# Patient Record
Sex: Female | Born: 1985 | Race: White | Hispanic: No | Marital: Married | Smoking: Never smoker
Health system: Southern US, Community
[De-identification: ages and names within clinical notes are randomized; demographics above are authoritative.]

## PROBLEM LIST (undated history)

## (undated) DIAGNOSIS — E079 Disorder of thyroid, unspecified: Secondary | ICD-10-CM

## (undated) DIAGNOSIS — Z8619 Personal history of other infectious and parasitic diseases: Secondary | ICD-10-CM

## (undated) DIAGNOSIS — G5603 Carpal tunnel syndrome, bilateral upper limbs: Secondary | ICD-10-CM

## (undated) HISTORY — PX: IUD REMOVAL: SHX5392

## (undated) HISTORY — DX: Personal history of other infectious and parasitic diseases: Z86.19

## (undated) HISTORY — DX: Carpal tunnel syndrome, bilateral upper limbs: G56.03

## (undated) HISTORY — DX: Disorder of thyroid, unspecified: E07.9

---

## 2009-10-24 ENCOUNTER — Inpatient Hospital Stay: Payer: Self-pay

## 2010-01-18 ENCOUNTER — Ambulatory Visit: Payer: Self-pay | Admitting: Obstetrics & Gynecology

## 2010-01-21 ENCOUNTER — Ambulatory Visit: Payer: Self-pay | Admitting: Obstetrics & Gynecology

## 2014-03-03 NOTE — L&D Delivery Note (Signed)
Delivery Note At 9:48 PM a viable and healthy female was delivered via Vaginal, Spontaneous Delivery (Presentation: Left Occiput Anterior).  APGAR: 9, 9; weight pending .   Placenta status: Intact, Spontaneous.  Cord: 3 vessels with the following complications: None.  Cord pH: na  Anesthesia:  none Episiotomy:  none Lacerations:  none Suture Repair: na Est. Blood Loss (mL):  50  Mom to postpartum.  Baby to Couplet care / Skin to Skin.  Keyira Mondesir J 12/29/2014, 10:01 PM

## 2014-06-02 LAB — OB RESULTS CONSOLE RUBELLA ANTIBODY, IGM: RUBELLA: IMMUNE

## 2014-06-02 LAB — OB RESULTS CONSOLE ABO/RH: RH TYPE: POSITIVE

## 2014-06-02 LAB — OB RESULTS CONSOLE GC/CHLAMYDIA
Chlamydia: NEGATIVE
GC PROBE AMP, GENITAL: NEGATIVE

## 2014-06-02 LAB — OB RESULTS CONSOLE RPR: RPR: NONREACTIVE

## 2014-06-02 LAB — OB RESULTS CONSOLE HEPATITIS B SURFACE ANTIGEN: Hepatitis B Surface Ag: NEGATIVE

## 2014-06-02 LAB — OB RESULTS CONSOLE ANTIBODY SCREEN: ANTIBODY SCREEN: NEGATIVE

## 2014-06-02 LAB — OB RESULTS CONSOLE HIV ANTIBODY (ROUTINE TESTING): HIV: NONREACTIVE

## 2014-11-29 LAB — OB RESULTS CONSOLE GBS: STREP GROUP B AG: POSITIVE

## 2014-12-27 ENCOUNTER — Other Ambulatory Visit: Payer: Self-pay | Admitting: Obstetrics and Gynecology

## 2014-12-27 ENCOUNTER — Telehealth (HOSPITAL_COMMUNITY): Payer: Self-pay | Admitting: *Deleted

## 2014-12-27 ENCOUNTER — Encounter (HOSPITAL_COMMUNITY): Payer: Self-pay | Admitting: *Deleted

## 2014-12-27 NOTE — Telephone Encounter (Signed)
Preadmission screen  

## 2014-12-29 ENCOUNTER — Inpatient Hospital Stay (HOSPITAL_COMMUNITY)
Admission: AD | Admit: 2014-12-29 | Discharge: 2014-12-30 | DRG: 775 | Disposition: A | Discharge status: Home / Self Care | Payer: No Typology Code available for payment source | Source: Ambulatory Visit | Attending: Obstetrics and Gynecology | Admitting: Obstetrics and Gynecology

## 2014-12-29 ENCOUNTER — Encounter (HOSPITAL_COMMUNITY): Payer: Self-pay | Admitting: Student

## 2014-12-29 DIAGNOSIS — Z3A40 40 weeks gestation of pregnancy: Secondary | ICD-10-CM | POA: Diagnosis not present

## 2014-12-29 DIAGNOSIS — Z841 Family history of disorders of kidney and ureter: Secondary | ICD-10-CM | POA: Diagnosis not present

## 2014-12-29 DIAGNOSIS — O480 Post-term pregnancy: Principal | ICD-10-CM | POA: Diagnosis not present

## 2014-12-29 DIAGNOSIS — Z809 Family history of malignant neoplasm, unspecified: Secondary | ICD-10-CM

## 2014-12-29 DIAGNOSIS — Z825 Family history of asthma and other chronic lower respiratory diseases: Secondary | ICD-10-CM

## 2014-12-29 DIAGNOSIS — Z3490 Encounter for supervision of normal pregnancy, unspecified, unspecified trimester: Secondary | ICD-10-CM

## 2014-12-29 LAB — CBC
HEMATOCRIT: 38 % (ref 36.0–46.0)
Hemoglobin: 13.3 g/dL (ref 12.0–15.0)
MCH: 30.4 pg (ref 26.0–34.0)
MCHC: 35 g/dL (ref 30.0–36.0)
MCV: 86.8 fL (ref 78.0–100.0)
Platelets: 230 10*3/uL (ref 150–400)
RBC: 4.38 MIL/uL (ref 3.87–5.11)
RDW: 13.8 % (ref 11.5–15.5)
WBC: 23.2 10*3/uL — ABNORMAL HIGH (ref 4.0–10.5)

## 2014-12-29 LAB — TYPE AND SCREEN
ABO/RH(D): O POS
Antibody Screen: NEGATIVE

## 2014-12-29 MED ORDER — FLEET ENEMA 7-19 GM/118ML RE ENEM: 1.0000 | ENEMA | RECTAL | Status: DC | PRN

## 2014-12-29 MED ORDER — WITCH HAZEL-GLYCERIN EX PADS
1.0000 "application " | MEDICATED_PAD | CUTANEOUS | Status: DC | PRN
  Administered 2014-12-30: 1 via TOPICAL

## 2014-12-29 MED ORDER — OXYTOCIN 40 UNITS IN LACTATED RINGERS INFUSION - SIMPLE MED
62.5000 mL/h | INTRAVENOUS | Status: DC
  Administered 2014-12-29: 40 [IU] via INTRAVENOUS
  Administered 2014-12-29: 999 mL/h via INTRAVENOUS

## 2014-12-29 MED ORDER — CITRIC ACID-SODIUM CITRATE 334-500 MG/5ML PO SOLN: 30.0000 mL | ORAL | Status: DC | PRN

## 2014-12-29 MED ORDER — OXYCODONE-ACETAMINOPHEN 5-325 MG PO TABS: 2.0000 | ORAL_TABLET | ORAL | Status: DC | PRN

## 2014-12-29 MED ORDER — OXYTOCIN 40 UNITS IN LACTATED RINGERS INFUSION - SIMPLE MED
INTRAVENOUS | Status: AC
  Administered 2014-12-29: 40 [IU] via INTRAVENOUS
  Filled 2014-12-29: qty 1000

## 2014-12-29 MED ORDER — ACETAMINOPHEN 325 MG PO TABS: 650.0000 mg | ORAL_TABLET | ORAL | Status: DC | PRN

## 2014-12-29 MED ORDER — OXYTOCIN 40 UNITS IN LACTATED RINGERS INFUSION - SIMPLE MED: 1.0000 m[IU]/min | INTRAVENOUS | Status: DC

## 2014-12-29 MED ORDER — SODIUM CHLORIDE 0.9 % IV SOLN
2.0000 g | Freq: Once | INTRAVENOUS | Status: AC
  Administered 2014-12-29: 2 g via INTRAVENOUS
  Filled 2014-12-29: qty 2000

## 2014-12-29 MED ORDER — OXYCODONE-ACETAMINOPHEN 5-325 MG PO TABS: 1.0000 | ORAL_TABLET | ORAL | Status: DC | PRN

## 2014-12-29 MED ORDER — SODIUM CHLORIDE 0.9 % IV SOLN
1.0000 g | INTRAVENOUS | Status: DC
  Filled 2014-12-29 (×5): qty 1000

## 2014-12-29 MED ORDER — LACTATED RINGERS IV SOLN: 500.0000 mL | INTRAVENOUS | Status: DC | PRN

## 2014-12-29 MED ORDER — BUTORPHANOL TARTRATE 1 MG/ML IJ SOLN
1.0000 mg | Freq: Once | INTRAMUSCULAR | Status: AC
  Administered 2014-12-29: 1 mg via INTRAVENOUS

## 2014-12-29 MED ORDER — LIDOCAINE HCL (PF) 1 % IJ SOLN
INTRAMUSCULAR | Status: AC
  Administered 2014-12-29: 30 mL via SUBCUTANEOUS
  Filled 2014-12-29: qty 30

## 2014-12-29 MED ORDER — TERBUTALINE SULFATE 1 MG/ML IJ SOLN
0.2500 mg | Freq: Once | INTRAMUSCULAR | Status: DC | PRN
  Filled 2014-12-29: qty 1

## 2014-12-29 MED ORDER — PENICILLIN G POTASSIUM 5000000 UNITS IJ SOLR
5.0000 10*6.[IU] | Freq: Once | INTRAVENOUS | Status: DC
  Filled 2014-12-29: qty 5

## 2014-12-29 MED ORDER — LIDOCAINE HCL (PF) 1 % IJ SOLN
30.0000 mL | INTRAMUSCULAR | Status: DC | PRN
Start: 2014-12-29 — End: 2014-12-30
  Administered 2014-12-29: 30 mL via SUBCUTANEOUS
  Filled 2014-12-29: qty 30

## 2014-12-29 MED ORDER — BUTORPHANOL TARTRATE 1 MG/ML IJ SOLN
INTRAMUSCULAR | Status: AC
  Administered 2014-12-29: 1 mg via INTRAVENOUS
  Filled 2014-12-29: qty 1

## 2014-12-29 MED ORDER — PENICILLIN G POTASSIUM 5000000 UNITS IJ SOLR
2.5000 10*6.[IU] | INTRAVENOUS | Status: DC
  Filled 2014-12-29 (×2): qty 2.5

## 2014-12-29 MED ORDER — OXYTOCIN BOLUS FROM INFUSION: 500.0000 mL | INTRAVENOUS | Status: DC

## 2014-12-29 MED ORDER — DIBUCAINE 1 % RE OINT
1.0000 "application " | TOPICAL_OINTMENT | RECTAL | Status: DC | PRN
  Administered 2014-12-30: 1 via RECTAL
  Filled 2014-12-29: qty 28

## 2014-12-29 MED ORDER — ONDANSETRON HCL 4 MG/2ML IJ SOLN
4.0000 mg | Freq: Four times a day (QID) | INTRAMUSCULAR | Status: DC | PRN
Start: 2014-12-29 — End: 2014-12-30

## 2014-12-29 MED ORDER — IBUPROFEN 600 MG PO TABS
600.0000 mg | ORAL_TABLET | Freq: Four times a day (QID) | ORAL | Status: DC
  Administered 2014-12-29 – 2014-12-30 (×4): 600 mg via ORAL
  Filled 2014-12-29 (×4): qty 1

## 2014-12-29 MED ORDER — BENZOCAINE-MENTHOL 20-0.5 % EX AERO
1.0000 "application " | INHALATION_SPRAY | CUTANEOUS | Status: DC | PRN
  Administered 2014-12-29: 1 via TOPICAL
  Filled 2014-12-29: qty 56

## 2014-12-29 MED ORDER — LACTATED RINGERS IV SOLN
INTRAVENOUS | Status: DC
  Administered 2014-12-29: 20:00:00 via INTRAVENOUS

## 2014-12-29 NOTE — Plan of Care (Signed)
Problem: Consults Goal: [facility] Patient Education (See Patient Education module for education specifics.) Outcome: Not Progressing Pt oriented to room, plan of care discussed, and questions answered.

## 2014-12-29 NOTE — Progress Notes (Signed)
Pt assisted to BR for initial PP void. Pt ambulated safely, void of urine noted, pt educated on perineal hygiene and can return correct demo. Pt asisted to w/c safely and prepped for routine transfer to M/B.  

## 2014-12-29 NOTE — H&P (Signed)
Shelby Turner is a 29 y.o. female presenting for labor.  Maternal Medical History:  Reason for admission: Contractions.   Contractions: Onset was 1-2 hours ago.   Frequency: regular.   Perceived severity is moderate.    Fetal activity: Perceived fetal activity is normal.   Last perceived fetal movement was within the past hour.    Prenatal complications: no prenatal complications Prenatal Complications - Diabetes: none.    OB History    Gravida Para Term Preterm AB TAB SAB Ectopic Multiple Living   3 1 1  1  1   1      Past Medical History  Diagnosis Date  . Hx of varicella    Past Surgical History  Procedure Laterality Date  . Iud removal      implanted in uterus   Family History: family history includes COPD in her paternal grandfather; Cancer in her maternal grandfather; Kidney Stones in her maternal aunt and maternal grandfather. Social History:  reports that she has never smoked. She has never used smokeless tobacco. She reports that she does not drink alcohol or use illicit drugs.   Prenatal Transfer Tool  Maternal Diabetes: No Genetic Screening: Normal Maternal Ultrasounds/Referrals: Normal Fetal Ultrasounds or other Referrals:  None Maternal Substance Abuse:  No Significant Maternal Medications:  None Significant Maternal Lab Results:  None Other Comments:  gbs positive  Review of Systems  Constitutional: Negative.   All other systems reviewed and are negative.   Dilation: Lip/rim Effacement (%): 100 Station: 0 Exam by:: Dr. Billy Coastaavon Blood pressure 135/83, pulse 88, temperature 98 F (36.7 C), temperature source Oral, resp. rate 22, height 5\' 8"  (1.727 m), weight 166 lb (75.297 kg), last menstrual period 03/21/2014. Maternal Exam:  Uterine Assessment: Contraction strength is moderate.  Contraction frequency is regular.   Abdomen: Patient reports no abdominal tenderness. Fetal presentation: vertex  Introitus: Normal vulva. Normal vagina.  Ferning  test: negative.  Nitrazine test: negative. Amniotic fluid character: not assessed.  Pelvis: adequate for delivery.   Cervix: Cervix evaluated by digital exam.     Physical Exam  Vitals reviewed. Constitutional: She is oriented to person, place, and time. She appears well-developed and well-nourished.  Neck: Normal range of motion. Neck supple.  Cardiovascular: Normal rate and regular rhythm.   Respiratory: Effort normal and breath sounds normal.  GI: Soft. Bowel sounds are normal.  Genitourinary: Vagina normal. Guaiac positive stool.  Musculoskeletal: Normal range of motion.  Neurological: She is alert and oriented to person, place, and time. She has normal reflexes.  Skin: Skin is warm and dry.  Psychiatric: She has a normal mood and affect.    Prenatal labs: ABO, Rh: --/--/O POS (10/28 1945) Antibody: NEG (10/28 1945) Rubella: Immune (04/01 0000) RPR: Nonreactive (04/01 0000)  HBsAg: Negative (04/01 0000)  HIV: Non-reactive (04/01 0000)  GBS: Positive (09/28 0000)   Assessment/Plan: Active labor gBS positive admit   Shelby Turner J 12/29/2014, 9:59 PM

## 2014-12-29 NOTE — MAU Note (Signed)
Pt to be triaged in 166 per Cleone SlimH. Mitchell RN

## 2014-12-29 NOTE — Progress Notes (Signed)
Bedside report given and care relinquished to DushoreSue, [place].

## 2014-12-30 ENCOUNTER — Encounter (HOSPITAL_COMMUNITY): Payer: Self-pay | Admitting: Obstetrics and Gynecology

## 2014-12-30 LAB — CBC
HEMATOCRIT: 32.8 % — AB (ref 36.0–46.0)
HEMOGLOBIN: 11.1 g/dL — AB (ref 12.0–15.0)
MCH: 29.5 pg (ref 26.0–34.0)
MCHC: 33.8 g/dL (ref 30.0–36.0)
MCV: 87.2 fL (ref 78.0–100.0)
Platelets: 193 10*3/uL (ref 150–400)
RBC: 3.76 MIL/uL — AB (ref 3.87–5.11)
RDW: 13.8 % (ref 11.5–15.5)
WBC: 21.2 10*3/uL — ABNORMAL HIGH (ref 4.0–10.5)

## 2014-12-30 LAB — RPR: RPR: NONREACTIVE

## 2014-12-30 LAB — ABO/RH: ABO/RH(D): O POS

## 2014-12-30 MED ORDER — ONDANSETRON HCL 4 MG PO TABS: 4.0000 mg | ORAL_TABLET | ORAL | Status: DC | PRN

## 2014-12-30 MED ORDER — PRENATAL MULTIVITAMIN CH
1.0000 | ORAL_TABLET | Freq: Every day | ORAL | Status: DC
  Administered 2014-12-30: 1 via ORAL
  Filled 2014-12-30: qty 1

## 2014-12-30 MED ORDER — DIPHENHYDRAMINE HCL 25 MG PO CAPS: 25.0000 mg | ORAL_CAPSULE | Freq: Four times a day (QID) | ORAL | Status: DC | PRN

## 2014-12-30 MED ORDER — IBUPROFEN 600 MG PO TABS: 600.0000 mg | ORAL_TABLET | Freq: Four times a day (QID) | ORAL | Status: DC

## 2014-12-30 MED ORDER — OXYCODONE-ACETAMINOPHEN 5-325 MG PO TABS: 1.0000 | ORAL_TABLET | ORAL | Status: DC | PRN

## 2014-12-30 MED ORDER — METHYLERGONOVINE MALEATE 0.2 MG PO TABS: 0.2000 mg | ORAL_TABLET | ORAL | Status: DC | PRN

## 2014-12-30 MED ORDER — ZOLPIDEM TARTRATE 5 MG PO TABS: 5.0000 mg | ORAL_TABLET | Freq: Every evening | ORAL | Status: DC | PRN

## 2014-12-30 MED ORDER — OXYCODONE-ACETAMINOPHEN 5-325 MG PO TABS: 2.0000 | ORAL_TABLET | ORAL | Status: DC | PRN

## 2014-12-30 MED ORDER — SIMETHICONE 80 MG PO CHEW: 80.0000 mg | CHEWABLE_TABLET | ORAL | Status: DC | PRN

## 2014-12-30 MED ORDER — LANOLIN HYDROUS EX OINT: TOPICAL_OINTMENT | CUTANEOUS | Status: DC | PRN

## 2014-12-30 MED ORDER — METHYLERGONOVINE MALEATE 0.2 MG/ML IJ SOLN: 0.2000 mg | INTRAMUSCULAR | Status: DC | PRN

## 2014-12-30 MED ORDER — TETANUS-DIPHTH-ACELL PERTUSSIS 5-2.5-18.5 LF-MCG/0.5 IM SUSP: 0.5000 mL | Freq: Once | INTRAMUSCULAR | Status: DC

## 2014-12-30 MED ORDER — SENNOSIDES-DOCUSATE SODIUM 8.6-50 MG PO TABS: 2.0000 | ORAL_TABLET | ORAL | Status: DC

## 2014-12-30 MED ORDER — ONDANSETRON HCL 4 MG/2ML IJ SOLN: 4.0000 mg | INTRAMUSCULAR | Status: DC | PRN

## 2014-12-30 MED ORDER — ACETAMINOPHEN 325 MG PO TABS: 650.0000 mg | ORAL_TABLET | ORAL | Status: DC | PRN

## 2014-12-30 NOTE — Discharge Summary (Signed)
OB Discharge Summary     Patient Name: Shelby Turner DOB: 1985-11-15 MRN: [id number]  Date of admission: 12/29/2014 Delivering MD: Olivia MackieAAVON, RICHARD   Date of discharge: 12/30/2014  Admitting diagnosis: 40 WKS ACTIVE LABOR Intrauterine pregnancy: 5610w3d     Secondary diagnosis:  Principal Problem:   Postpartum care following vaginal delivery (10/28) Active Problems:   Pregnancy  Additional problems: none     Discharge diagnosis: Postterm Pregnancy - delivered                                                                                                Post partum procedures:none  Augmentation: none  Complications: None  Hospital course:  Onset of Labor With Vaginal Delivery     29 y.o. yo [id number] at 410w3d was admitted in Active Laboron 12/29/2014. Patient had an uncomplicated labor course as follows:  Membrane Rupture Time/Date: 7:35 PM ,12/29/2014   Intrapartum Procedures: Episiotomy: None [1]                                         Lacerations:  None [1]  Patient had a delivery of a Viable infant. 12/29/2014  Information for the patient's newborn:  Kirke ShaggyVeiga, Girl Deshonda [[id number]][[id number]]  Delivery Method: Vaginal, Spontaneous Delivery (Filed from Delivery Summary)    Pateint had an uncomplicated postpartum course.  She is ambulating, tolerating a regular diet, passing flatus, and urinating well. Patient is discharged home in stable condition on No discharge date for patient encounter.Marland Kitchen.    Physical exam  Filed Vitals:   12/29/14 2315 12/29/14 2350 12/30/14 0040 12/30/14 0424  BP: 119/56 117/57 120/54 112/53  Pulse: 100 99 92 68  Temp:  98.8 F (37.1 C) 99 F (37.2 C) 99 F (37.2 C)  TempSrc:  Oral Oral Oral  Resp:  18 18 18   Height:      Weight:       General: alert, cooperative and no distress Lochia: appropriate Uterine Fundus: firm DVT Evaluation: No evidence of DVT seen on physical exam. Negative Homan's sign. No cords or calf tenderness. No significant  calf/ankle edema. Labs: Lab Results  Component Value Date   WBC 21.2* 12/30/2014   HGB 11.1* 12/30/2014   HCT 32.8* 12/30/2014   MCV 87.2 12/30/2014   PLT 193 12/30/2014   No flowsheet data found.  Discharge instruction: per After Visit Summary and "Baby and Me Booklet".  Medications:  Current facility-administered medications:  .  acetaminophen (TYLENOL) tablet 650 mg, 650 mg, Oral, Q4H PRN, Olivia Mackieichard Taavon, MD .  benzocaine-Menthol (DERMOPLAST) 20-0.5 % topical spray 1 application, 1 application, Topical, PRN, Olivia Mackieichard Taavon, MD, 1 application at 12/29/14 2302 .  witch hazel-glycerin (TUCKS) pad 1 application, 1 application, Topical, PRN, 1 application at 12/30/14 0529 **AND** dibucaine (NUPERCAINAL) 1 % rectal ointment 1 application, 1 application, Rectal, PRN, Olivia Mackieichard Taavon, MD, 1 application at 12/30/14 0529 .  diphenhydrAMINE (BENADRYL) capsule 25 mg, 25 mg, Oral, Q6H PRN, Olivia Mackieichard Taavon, MD .  ibuprofen (ADVIL,MOTRIN) tablet 600  mg, 600 mg, Oral, 4 times per day, Olivia Mackie, MD, 600 mg at 12/30/14 0529 .  lanolin ointment, , Topical, PRN, Olivia Mackie, MD .  methylergonovine (METHERGINE) tablet 0.2 mg, 0.2 mg, Oral, Q4H PRN **OR** methylergonovine (METHERGINE) injection 0.2 mg, 0.2 mg, Intramuscular, Q4H PRN, Olivia Mackie, MD .  ondansetron (ZOFRAN) tablet 4 mg, 4 mg, Oral, Q4H PRN **OR** ondansetron (ZOFRAN) injection 4 mg, 4 mg, Intravenous, Q4H PRN, Olivia Mackie, MD .  oxyCODONE-acetaminophen (PERCOCET/ROXICET) 5-325 MG per tablet 1 tablet, 1 tablet, Oral, Q4H PRN, Olivia Mackie, MD .  oxyCODONE-acetaminophen (PERCOCET/ROXICET) 5-325 MG per tablet 2 tablet, 2 tablet, Oral, Q4H PRN, Olivia Mackie, MD .  prenatal multivitamin tablet 1 tablet, 1 tablet, Oral, Q1200, Olivia Mackie, MD .  senna-docusate (Senokot-S) tablet 2 tablet, 2 tablet, Oral, Q24H, Olivia Mackie, MD .  simethicone (MYLICON) chewable tablet 80 mg, 80 mg, Oral, PRN, Olivia Mackie, MD .  Tdap  (BOOSTRIX) injection 0.5 mL, 0.5 mL, Intramuscular, Once, Olivia Mackie, MD, 0.5 mL at 12/30/14 0115 .  zolpidem (AMBIEN) tablet 5 mg, 5 mg, Oral, QHS PRN, Olivia Mackie, MD After visit meds:    Medication List    TAKE these medications        ibuprofen 600 MG tablet  Commonly known as:  ADVIL,MOTRIN  Take 1 tablet (600 mg total) by mouth every 6 (six) hours.     Lysine 500 MG Caps  Take 1 capsule by mouth daily.     prenatal multivitamin Tabs tablet  Take 1 tablet by mouth daily at 12 noon.        Diet: routine diet  Activity: Advance as tolerated. Pelvic rest for 6 weeks.   Outpatient follow up:6 weeks Follow up Appt:Future Appointments Date Time Provider Department Center  01/01/2015 7:00 AM [facility] None   Follow up Visit:No Follow-up on file.  Postpartum contraception: Undecided  Newborn Data: Live born female on 12/29/2014 Birth Weight: 6 lb 2.8 oz (2800 g) APGAR: 9, 9  Baby Feeding: Breast Disposition:home with mother   12/30/2014 7:33 AM Jovante Hammitt, Terence Lux, MSN, CNM

## 2014-12-30 NOTE — Lactation Note (Signed)
This note was copied from the chart of Shelby Richardson Doppshley Mcswain. Lactation Consultation Note Experienced BF mom BF her 1st child 8 months. Denies difficulty. States this baby is BF well w/no problems. Currently BF in cradle position. Denies painful latches.  Mom encouraged to feed baby w/feeding cuesMom encouraged to do skin-to-skin.Reviewed Baby & Me book's Breastfeeding Basics. WH/LC brochure given w/resources, support groups and LC services. Patient Name: Shelby Turner ZOXWR'UToday's Date: 12/30/2014 Reason for consult: Initial assessment   Maternal Data Has patient been taught Hand Expression?: Yes Does the patient have breastfeeding experience prior to this delivery?: Yes  Feeding Feeding Type: Breast Fed Length of feed: 15 min  LATCH Score/Interventions Latch: Grasps breast easily, tongue down, lips flanged, rhythmical sucking.  Audible Swallowing: A few with stimulation Intervention(s): Hand expression  Type of Nipple: Everted at rest and after stimulation  Comfort (Breast/Nipple): Soft / non-tender     Hold (Positioning): No assistance needed to correctly position infant at breast. Intervention(s): Skin to skin;Position options;Breastfeeding basics reviewed;Support Pillows  LATCH Score: 9  Lactation Tools Discussed/Used     Consult Status Consult Status: PRN Date: 12/31/14 Follow-up type: In-patient    Avannah Decker, Diamond NickelLAURA G 12/30/2014, 1:20 PM

## 2014-12-30 NOTE — Discharge Instructions (Signed)
Breast Pumping Tips °If you are breastfeeding, there may be times when you cannot feed your baby directly. Returning to work or going on a trip are common examples. Pumping allows you to store breast milk and feed it to your baby later.  °You may not get much milk when you first start to pump. Your breasts should start to make more after a few days. If you pump at the times you usually feed your baby, you may be able to keep making enough milk to feed your baby without also using formula. The more often you pump, the more milk you will produce.  °WHEN SHOULD I PUMP?  °· You can begin to pump soon after delivery. However, some experts recommend waiting about 4 weeks before giving your infant a bottle to make sure breastfeeding is going well.  °· If you plan to return to work, begin pumping a few weeks before. This will help you develop techniques that work best for you. It also lets you build up a supply of breast milk.   °· When you are with your infant, feed on demand and pump after each feeding.   °· When you are away from your infant for several hours, pump for about 15 minutes every 2-3 hours. Pump both breasts at the same time if you can.   °· If your infant has a formula feeding, make sure to pump around the same time.     °· If you drink any alcohol, wait 2 hours before pumping.   °HOW DO I PREPARE TO PUMP? °Your let-down reflex is the natural reaction to stimulation that makes your breast milk flow. It is easier to stimulate this reflex when you are relaxed. Find relaxation techniques that work for you. If you have difficulty with your let-down reflex, try these methods:  °· Smell one of your infant's blankets or an item of clothing.   °· Look at a picture or video of your infant.   °· Sit in a quiet, private space.   °· Massage the breast you plan to pump.   °· Place soothing warmth on the breast.   °· Play relaxing music.   °WHAT ARE SOME GENERAL BREAST PUMPING TIPS? °· Wash your hands before you pump. You  do not need to wash your nipples or breasts. °· There are three ways to pump. °¨ You can use your hand to massage and compress your breast. °¨ You can use a handheld manual pump. °¨ You can use an electric pump.   °· Make sure the suction cup (flange) on the breast pump is the right size. Place the flange directly over the nipple. If it is the wrong size or placed the wrong way, it may be painful and cause nipple damage.   °· If pumping is uncomfortable, apply a small amount of purified or modified lanolin to your nipple and areola. °· If you are using an electric pump, adjust the speed and suction power to be more comfortable. °· If pumping is painful or if you are not getting very much milk, you may need a different type of pump. A lactation consultant can help you determine what type of pump to use.   °· Keep a full water bottle near you at all times. Drinking lots of fluid helps you make more milk.  °· You can store your milk to use later. Pumped breast milk can be stored in a sealable, sterile container or plastic bag. Label all stored breast milk with the date you pumped it. °¨ Milk can stay out at room temperature for up to 8 hours. °¨   You can store your milk in the refrigerator for up to 8 days. °¨ You can store your milk in the freezer for 3 months. Thaw frozen milk using warm water. Do not put it in the microwave. °· Do not smoke. Smoking can lower your milk supply and harm your infant. If you need help quitting, ask your health care provider to recommend a program.   °WHEN SHOULD I CALL MY HEALTH CARE PROVIDER OR A LACTATION CONSULTANT? °· You are having trouble pumping. °· You are concerned that you are not making enough milk. °· You have nipple pain, soreness, or redness. °· You want to use birth control. Birth control pills may lower your milk supply. Talk to your health care provider about your options. °  °This information is not intended to replace advice given to you by your health care provider.  Make sure you discuss any questions you have with your health care provider. °  °Document Released: 08/07/2009 Document Revised: 02/22/2013 Document Reviewed: 12/10/2012 °[employer]vier Interactive Patient Education ©2016 [employer] °Postpartum Depression and Baby Blues °The postpartum period begins right after the birth of a baby. During this time, there is often a great amount of joy and excitement. It is also a time of many changes in the life of the parents. Regardless of how many times a mother gives birth, each child brings new challenges and dynamics to the family. It is not unusual to have feelings of excitement along with confusing shifts in moods, emotions, and thoughts. All mothers are at risk of developing postpartum depression or the "baby blues." These mood changes can occur right after giving birth, or they may occur many months after giving birth. The baby blues or postpartum depression can be mild or severe. Additionally, postpartum depression can go away rather quickly, or it can be a long-term condition.  °CAUSES °Raised hormone levels and the rapid drop in those levels are thought to be a main cause of postpartum depression and the baby blues. A number of hormones change during and after pregnancy. Estrogen and progesterone usually decrease right after the delivery of your baby. The levels of thyroid hormone and various cortisol steroids also rapidly drop. Other factors that play a role in these mood changes include major life events and genetics.  °RISK FACTORS °If you have any of the following risks for the baby blues or postpartum depression, know what symptoms to watch out for during the postpartum period. Risk factors that may increase the likelihood of getting the baby blues or postpartum depression include: °· Having a personal or family history of depression.   °· Having depression while being pregnant.   °· Having premenstrual mood issues or mood issues related to oral  contraceptives. °· Having a lot of life stress.   °· Having marital conflict.   °· Lacking a social support network.   °· Having a baby with special needs.   °· Having health problems, such as diabetes.   °SIGNS AND SYMPTOMS °Symptoms of baby blues include: °· Brief changes in mood, such as going from extreme happiness to sadness. °· Decreased concentration.   °· Difficulty sleeping.   °· Crying spells, tearfulness.   °· Irritability.   °· Anxiety.   °Symptoms of postpartum depression typically begin within the first month after giving birth. These symptoms include: °· Difficulty sleeping or excessive sleepiness.   °· Marked weight loss.   °· Agitation.   °· Feelings of worthlessness.   °· Lack of interest in activity or food.   °Postpartum psychosis is a very serious condition and can be dangerous. Fortunately, it is   rare. Displaying any of the following symptoms is cause for immediate medical attention. Symptoms of postpartum psychosis include:  °· Hallucinations and delusions.   °· Bizarre or disorganized behavior.   °· Confusion or disorientation.   °DIAGNOSIS  °A diagnosis is made by an evaluation of your symptoms. There are no medical or lab tests that lead to a diagnosis, but there are various questionnaires that a health care provider may use to identify those with the baby blues, postpartum depression, or psychosis. Often, a screening tool called the Edinburgh Postnatal Depression Scale is used to diagnose depression in the postpartum period.  °TREATMENT °The baby blues usually goes away on its own in 1-2 weeks. Social support is often all that is needed. You will be encouraged to get adequate sleep and rest. Occasionally, you may be given medicines to help you sleep.  °Postpartum depression requires treatment because it can last several months or longer if it is not treated. Treatment may include individual or group therapy, medicine, or both to address any social, physiological, and psychological factors  that may play a role in the depression. Regular exercise, a healthy diet, rest, and social support may also be strongly recommended.  °Postpartum psychosis is more serious and needs treatment right away. Hospitalization is often needed. °HOME CARE INSTRUCTIONS °· Get as much rest as you can. Nap when the baby sleeps.   °· Exercise regularly. Some women find yoga and walking to be beneficial.   °· Eat a balanced and nourishing diet.   °· Do little things that you enjoy. Have a cup of tea, take a bubble bath, read your favorite magazine, or listen to your favorite music. °· Avoid alcohol.   °· Ask for help with household chores, cooking, grocery shopping, or running errands as needed. Do not try to do everything.   °· Talk to people close to you about how you are feeling. Get support from your partner, family members, friends, or other new moms. °· Try to stay positive in how you think. Think about the things you are grateful for.   °· Do not spend a lot of time alone.   °· Only take over-the-counter or prescription medicine as directed by your health care provider. °· Keep all your postpartum appointments.   °· Let your health care provider know if you have any concerns.   °SEEK MEDICAL CARE IF: °You are having a reaction to or problems with your medicine. °SEEK IMMEDIATE MEDICAL CARE IF: °· You have suicidal feelings.   °· You think you may harm the baby or someone else. °MAKE SURE YOU: °· Understand these instructions. °· Will watch your condition. °· Will get help right away if you are not doing well or get worse. °  °This information is not intended to replace advice given to you by your health care provider. Make sure you discuss any questions you have with your health care provider. °  °Document Released: 11/22/2003 Document Revised: 02/22/2013 Document Reviewed: 11/29/2012 °Elsevier Interactive Patient Education ©2016 [employer] °Postpartum Care After Vaginal Delivery °After you deliver your newborn  (postpartum period), the usual stay in the hospital is 24-72 hours. If there were problems with your labor or delivery, or if you have other medical problems, you might be in the hospital longer.  °While you are in the hospital, you will receive help and instructions on how to care for yourself and your newborn during the postpartum period.  °While you are in the hospital: °· Be sure to tell your nurses if you have pain or discomfort, as well as   where you feel the pain and what makes the pain worse. °· If you had an incision made near your vagina (episiotomy) or if you had some tearing during delivery, the nurses may put ice packs on your episiotomy or tear. The ice packs may help to reduce the pain and swelling. °· If you are breastfeeding, you may feel uncomfortable contractions of your uterus for a couple of weeks. This is normal. The contractions help your uterus get back to normal size. °· It is normal to have some bleeding after delivery. °¨ For the first 1-3 days after delivery, the flow is red and the amount may be similar to a period. °¨ It is common for the flow to start and stop. °¨ In the first few days, you may pass some small clots. Let your nurses know if you begin to pass large clots or your flow increases. °¨ Do not  flush blood clots down the toilet before having the nurse look at them. °¨ During the next 3-10 days after delivery, your flow should become more watery and pink or brown-tinged in color. °¨ Ten to fourteen days after delivery, your flow should be a small amount of yellowish-white discharge. °¨ The amount of your flow will decrease over the first few weeks after delivery. Your flow may stop in 6-8 weeks. Most women have had their flow stop by 12 weeks after delivery. °· You should change your sanitary pads frequently. °· Wash your hands thoroughly with soap and water for at least 20 seconds after changing pads, using the toilet, or before holding or feeding your newborn. °· You should  feel like you need to empty your bladder within the first 6-8 hours after delivery. °· In case you become weak, lightheaded, or faint, call your nurse before you get out of bed for the first time and before you take a shower for the first time. °· Within the first few days after delivery, your breasts may begin to feel tender and full. This is called engorgement. Breast tenderness usually goes away within 48-72 hours after engorgement occurs. You may also notice milk leaking from your breasts. If you are not breastfeeding, do not stimulate your breasts. Breast stimulation can make your breasts produce more milk. °· Spending as much time as possible with your newborn is very important. During this time, you and your newborn can feel close and get to know each other. Having your newborn stay in your room (rooming in) will help to strengthen the bond with your newborn.  It will give you time to get to know your newborn and become comfortable caring for your newborn. °· Your hormones change after delivery. Sometimes the hormone changes can temporarily cause you to feel sad or tearful. These feelings should not last more than a few days. If these feelings last longer than that, you should talk to your caregiver. °· If desired, talk to your caregiver about methods of family planning or contraception. °· Talk to your caregiver about immunizations. Your caregiver may want you to have the following immunizations before leaving the hospital: °¨ Tetanus, diphtheria, and pertussis (Tdap) or tetanus and diphtheria (Td) immunization. It is very important that you and your family (including grandparents) or others caring for your newborn are up-to-date with the Tdap or Td immunizations. The Tdap or Td immunization can help protect your newborn from getting ill. °¨ Rubella immunization. °¨ Varicella (chickenpox) immunization. °¨ Influenza immunization. You should receive this annual immunization if you did not receive the    immunization during your pregnancy. °  °This information is not intended to replace advice given to you by your health care provider. Make sure you discuss any questions you have with your health care provider. °  °Document Released: 12/15/2006 Document Revised: 11/12/2011 Document Reviewed: 10/15/2011 °[employer]vier Interactive Patient Education ©2016 [employer] °Breastfeeding and Mastitis °Mastitis is inflammation of the breast tissue. It can occur in women who are breastfeeding. This can make breastfeeding painful. Mastitis will sometimes go away on its own. Your health care provider will help determine if treatment is needed. °CAUSES °Mastitis is often associated with a blocked milk (lactiferous) duct. This can happen when too much milk builds up in the breast. Causes of excess milk in the breast can include: °· Poor latch-on. If your baby is not latched onto the breast properly, she or he may not empty your breast completely while breastfeeding. °· Allowing too much time to pass between feedings. °· Wearing a bra or other clothing that is too tight. This puts extra pressure on the lactiferous ducts so milk does not flow through them as it should. °Mastitis can also be caused by a bacterial infection. Bacteria may enter the breast tissue through cuts or openings in the skin. In women who are breastfeeding, this may occur because of cracked or irritated skin. Cracks in the skin are often caused when your baby does not latch on properly to the breast. °SIGNS AND SYMPTOMS °· Swelling, redness, tenderness, and pain in an area of the breast. °· Swelling of the glands under the arm on the same side. °· Fever may or may not accompany mastitis. °If an infection is allowed to progress, a collection of pus (abscess) may develop. °DIAGNOSIS  °Your health care provider can usually diagnose mastitis based on your symptoms and a physical exam. Tests may be done to help confirm the diagnosis. These may include: °· Removal of pus  from the breast by applying pressure to the area. This pus can be examined in the lab to determine which bacteria are present. If an abscess has developed, the fluid in the abscess can be removed with a needle. This can also be used to confirm the diagnosis and determine the bacteria present. In most cases, pus will not be present. °· Blood tests to determine if your body is fighting a bacterial infection. °· Mammogram or ultrasound tests to rule out other problems or diseases. °TREATMENT  °Mastitis that occurs with breastfeeding will sometimes go away on its own. Your health care provider may choose to wait 24 hours after first seeing you to decide whether a prescription medicine is needed. If your symptoms are worse after 24 hours, your health care provider will likely prescribe an antibiotic medicine to treat the mastitis. He or she will determine which bacteria are most likely causing the infection and will then select an appropriate antibiotic medicine. This is sometimes changed based on the results of tests performed to identify the bacteria, or if there is no response to the antibiotic medicine selected. Antibiotic medicines are usually given by mouth. You may also be given medicine for pain. °HOME CARE INSTRUCTIONS °· Only take over-the-counter or prescription medicines for pain, fever, or discomfort as directed by your health care provider. °· If your health care provider prescribed an antibiotic medicine, take the medicine as directed. Make sure you finish it even if you start to feel better. °· Do not wear a tight or underwire bra. Wear a soft, supportive bra. °· Increase your fluid   intake, especially if you have a fever. °· Continue to empty the breast. Your health care provider can tell you whether this milk is safe for your infant or needs to be thrown out. You may be told to stop nursing until your health care provider thinks it is safe for your baby. Use a breast pump if you are advised to stop  nursing. °· Keep your nipples clean and dry. °· Empty the first breast completely before going to the other breast. If your baby is not emptying your breasts completely for some reason, use a breast pump to empty your breasts. °· If you go back to work, pump your breasts while at work to stay in time with your nursing schedule. °· Avoid allowing your breasts to become overly filled with milk (engorged). °SEEK MEDICAL CARE IF: °· You have pus-like discharge from the breast. °· Your symptoms do not improve with the treatment prescribed by your health care provider within 2 days. °SEEK IMMEDIATE MEDICAL CARE IF: °· Your pain and swelling are getting worse. °· You have pain that is not controlled with medicine. °· You have a red line extending from the breast toward your armpit. °· You have a fever or persistent symptoms for more than 2-3 days. °· You have a fever and your symptoms suddenly get worse. °MAKE SURE YOU:  °· Understand these instructions. °· Will watch your condition. °· Will get help right away if you are not doing well or get worse. °  °This information is not intended to replace advice given to you by your health care provider. Make sure you discuss any questions you have with your health care provider. °  °Document Released: 06/14/2004 Document Revised: 02/22/2013 Document Reviewed: 09/23/2012 °[employer]vier Interactive Patient Education ©2016 [employer] ° °Breastfeeding °Deciding to breastfeed is one of the best choices you can make for you and your baby. A change in hormones during pregnancy causes your breast tissue to grow and increases the number and size of your milk ducts. These hormones also allow proteins, sugars, and fats from your blood supply to make breast milk in your milk-producing glands. Hormones prevent breast milk from being released before your baby is born as well as prompt milk flow after birth. Once breastfeeding has begun, thoughts of your baby, as well as his or her sucking or  crying, can stimulate the release of milk from your milk-producing glands.  °BENEFITS OF BREASTFEEDING °For Your Baby °· Your first milk (colostrum) helps your baby's digestive system function better. °· There are antibodies in your milk that help your baby fight off infections. °· Your baby has a lower incidence of asthma, allergies, and sudden infant death syndrome. °· The nutrients in breast milk are better for your baby than infant formulas and are designed uniquely for your baby's needs. °· Breast milk improves your baby's brain development. °· Your baby is less likely to develop other conditions, such as childhood obesity, asthma, or type 2 diabetes mellitus. °For You °· Breastfeeding helps to create a very special bond between you and your baby. °· Breastfeeding is convenient. Breast milk is always available at the correct temperature and costs nothing. °· Breastfeeding helps to burn calories and helps you lose the weight gained during pregnancy. °· Breastfeeding makes your uterus contract to its prepregnancy size faster and slows bleeding (lochia) after you give birth.   °· Breastfeeding helps to lower your risk of developing type 2 diabetes mellitus, osteoporosis, and breast or ovarian cancer later in life. °  SIGNS THAT YOUR BABY IS HUNGRY °Early Signs of Hunger °· Increased alertness or activity. °· Stretching. °· Movement of the head from side to side. °· Movement of the head and opening of the mouth when the corner of the mouth or cheek is stroked (rooting). °· Increased sucking sounds, smacking lips, cooing, sighing, or squeaking. °· Hand-to-mouth movements. °· Increased sucking of fingers or hands. °Late Signs of Hunger °· Fussing. °· Intermittent crying. °Extreme Signs of Hunger °Signs of extreme hunger will require calming and consoling before your baby will be able to breastfeed successfully. Do not wait for the following signs of extreme hunger to occur before you initiate  breastfeeding: °· Restlessness. °· A loud, strong cry. °· Screaming. °BREASTFEEDING BASICS °Breastfeeding Initiation °· Find a comfortable place to sit or lie down, with your neck and back well supported. °· Place a pillow or rolled up blanket under your baby to bring him or her to the level of your breast (if you are seated). Nursing pillows are specially designed to help support your arms and your baby while you breastfeed. °· Make sure that your baby's abdomen is facing your abdomen. °· Gently massage your breast. With your fingertips, massage from your chest wall toward your nipple in a circular motion. This encourages milk flow. You may need to continue this action during the feeding if your milk flows slowly. °· Support your breast with 4 fingers underneath and your thumb above your nipple. Make sure your fingers are well away from your nipple and your baby's mouth. °· Stroke your baby's lips gently with your finger or nipple. °· When your baby's mouth is open wide enough, quickly bring your baby to your breast, placing your entire nipple and as much of the colored area around your nipple (areola) as possible into your baby's mouth. °¨ More areola should be visible above your baby's upper lip than below the lower lip. °¨ Your baby's tongue should be between his or her lower gum and your breast. °· Ensure that your baby's mouth is correctly positioned around your nipple (latched). Your baby's lips should create a seal on your breast and be turned out (everted). °· It is common for your baby to suck about 2-3 minutes in order to start the flow of breast milk. °Latching °Teaching your baby how to latch on to your breast properly is very important. An improper latch can cause nipple pain and decreased milk supply for you and poor weight gain in your baby. Also, if your baby is not latched onto your nipple properly, he or she may swallow some air during feeding. This can make your baby fussy. Burping your baby when  you switch breasts during the feeding can help to get rid of the air. However, teaching your baby to latch on properly is still the best way to prevent fussiness from swallowing air while breastfeeding. °Signs that your baby has successfully latched on to your nipple: °· Silent tugging or silent sucking, without causing you pain. °· Swallowing heard between every 3-4 sucks. °· Muscle movement above and in front of his or her ears while sucking. °Signs that your baby has not successfully latched on to nipple: °· Sucking sounds or smacking sounds from your baby while breastfeeding. °· Nipple pain. °If you think your baby has not latched on correctly, slip your finger into the corner of your baby's mouth to break the suction and place it between your baby's gums. Attempt breastfeeding initiation again. °Signs of Successful Breastfeeding °  Signs from your baby: °· A gradual decrease in the number of sucks or complete cessation of sucking. °· Falling asleep. °· Relaxation of his or her body. °· Retention of a small amount of milk in his or her mouth. °· Letting go of your breast by himself or herself. °Signs from you: °· Breasts that have increased in firmness, weight, and size 1-3 hours after feeding. °· Breasts that are softer immediately after breastfeeding. °· Increased milk volume, as well as a change in milk consistency and color by the fifth day of breastfeeding. °· Nipples that are not sore, cracked, or bleeding. °Signs That Your Baby is Getting Enough Milk °· Wetting at least 3 diapers in a 24-hour period. The urine should be clear and pale yellow by age 5 days. °· At least 3 stools in a 24-hour period by age 5 days. The stool should be soft and yellow. °· At least 3 stools in a 24-hour period by age 7 days. The stool should be seedy and yellow. °· No loss of weight greater than 10% of birth weight during the first 3 days of age. °· Average weight gain of 4-7 ounces (113-198 g) per week after age 4  days. °· Consistent daily weight gain by age 5 days, without weight loss after the age of 2 weeks. °After a feeding, your baby may spit up a small amount. This is common. °BREASTFEEDING FREQUENCY AND DURATION °Frequent feeding will help you make more milk and can prevent sore nipples and breast engorgement. Breastfeed when you feel the need to reduce the fullness of your breasts or when your baby shows signs of hunger. This is called "breastfeeding on demand." Avoid introducing a pacifier to your baby while you are working to establish breastfeeding (the first 4-6 weeks after your baby is born). After this time you may choose to use a pacifier. Research has shown that pacifier use during the first year of a baby's life decreases the risk of sudden infant death syndrome (SIDS). °Allow your baby to feed on each breast as long as he or she wants. Breastfeed until your baby is finished feeding. When your baby unlatches or falls asleep while feeding from the first breast, offer the second breast. Because newborns are often sleepy in the first few weeks of life, you may need to awaken your baby to get him or her to feed. °Breastfeeding times will vary from baby to baby. However, the following rules can serve as a guide to help you ensure that your baby is properly fed: °· Newborns (babies 4 weeks of age or younger) may breastfeed every 1-3 hours. °· Newborns should not go longer than 3 hours during the day or 5 hours during the night without breastfeeding. °· You should breastfeed your baby a minimum of 8 times in a 24-hour period until you begin to introduce solid foods to your baby at around 6 months of age. °BREAST MILK PUMPING °Pumping and storing breast milk allows you to ensure that your baby is exclusively fed your breast milk, even at times when you are unable to breastfeed. This is especially important if you are going back to work while you are still breastfeeding or when you are not able to be present during  feedings. Your lactation consultant can give you guidelines on how long it is safe to store breast milk. °A breast pump is a machine that allows you to pump milk from your breast into a sterile bottle. The pumped breast milk can   then be stored in a refrigerator or freezer. Some breast pumps are operated by hand, while others use electricity. Ask your lactation consultant which type will work best for you. Breast pumps can be purchased, but some hospitals and breastfeeding support groups lease breast pumps on a monthly basis. A lactation consultant can teach you how to hand express breast milk, if you prefer not to use a pump. °CARING FOR YOUR BREASTS WHILE YOU BREASTFEED °Nipples can become dry, cracked, and sore while breastfeeding. The following recommendations can help keep your breasts moisturized and healthy: °· Avoid using soap on your nipples. °· Wear a supportive bra. Although not required, special nursing bras and tank tops are designed to allow access to your breasts for breastfeeding without taking off your entire bra or top. Avoid wearing underwire-style bras or extremely tight bras. °· Air dry your nipples for 3-4 minutes after each feeding. °· Use only cotton bra pads to absorb leaked breast milk. Leaking of breast milk between feedings is normal. °· Use lanolin on your nipples after breastfeeding. Lanolin helps to maintain your skin's normal moisture barrier. If you use pure lanolin, you do not need to wash it off before feeding your baby again. Pure lanolin is not toxic to your baby. You may also hand express a few drops of breast milk and gently massage that milk into your nipples and allow the milk to air dry. °In the first few weeks after giving birth, some women experience extremely full breasts (engorgement). Engorgement can make your breasts feel heavy, warm, and tender to the touch. Engorgement peaks within 3-5 days after you give birth. The following recommendations can help ease  engorgement: °· Completely empty your breasts while breastfeeding or pumping. You may want to start by applying warm, moist heat (in the shower or with warm water-soaked hand towels) just before feeding or pumping. This increases circulation and helps the milk flow. If your baby does not completely empty your breasts while breastfeeding, pump any extra milk after he or she is finished. °· Wear a snug bra (nursing or regular) or tank top for 1-2 days to signal your body to slightly decrease milk production. °· Apply ice packs to your breasts, unless this is too uncomfortable for you. °· Make sure that your baby is latched on and positioned properly while breastfeeding. °If engorgement persists after 48 hours of following these recommendations, contact your health care provider or a lactation consultant. °OVERALL HEALTH CARE RECOMMENDATIONS WHILE BREASTFEEDING °· Eat healthy foods. Alternate between meals and snacks, eating 3 of each per day. Because what you eat affects your breast milk, some of the foods may make your baby more irritable than usual. Avoid eating these foods if you are sure that they are negatively affecting your baby. °· Drink milk, fruit juice, and water to satisfy your thirst (about 10 glasses a day). °· Rest often, relax, and continue to take your prenatal vitamins to prevent fatigue, stress, and anemia. °· Continue breast self-awareness checks. °· Avoid chewing and smoking tobacco. Chemicals from cigarettes that pass into breast milk and exposure to secondhand smoke may harm your baby. °· Avoid alcohol and drug use, including marijuana. °Some medicines that may be harmful to your baby can pass through breast milk. It is important to ask your health care provider before taking any medicine, including all over-the-counter and prescription medicine as well as vitamin and herbal supplements. °It is possible to become pregnant while breastfeeding. If birth control is desired, ask your health care    provider about options that will be safe for your baby. °SEEK MEDICAL CARE IF: °· You feel like you want to stop breastfeeding or have become frustrated with breastfeeding. °· You have painful breasts or nipples. °· Your nipples are cracked or bleeding. °· Your breasts are red, tender, or warm. °· You have a swollen area on either breast. °· You have a fever or chills. °· You have nausea or vomiting. °· You have drainage other than breast milk from your nipples. °· Your breasts do not become full before feedings by the fifth day after you give birth. °· You feel sad and depressed. °· Your baby is too sleepy to eat well. °· Your baby is having trouble sleeping.   °· Your baby is wetting less than 3 diapers in a 24-hour period. °· Your baby has less than 3 stools in a 24-hour period. °· Your baby's skin or the white part of his or her eyes becomes yellow.   °· Your baby is not gaining weight by 5 days of age. °SEEK IMMEDIATE MEDICAL CARE IF: °· Your baby is overly tired (lethargic) and does not want to wake up and feed. °· Your baby develops an unexplained fever. °  °This information is not intended to replace advice given to you by your health care provider. Make sure you discuss any questions you have with your health care provider. °  °Document Released: 02/17/2005 Document Revised: 11/08/2014 Document Reviewed: 08/11/2012 °Elsevier Interactive Patient Education ©2016 [employer] ° °

## 2014-12-30 NOTE — Progress Notes (Signed)
Patient ID: Shelby Turner, female   DOB: 05-29-1985, 29 y.o.   MRN: [id number] PPD # 1 SVD  S:  Reports feeling well             Tolerating po/ No nausea or vomiting             Bleeding is light             Pain controlled with ibuprofen (OTC)             Up ad lib / ambulatory / voiding without difficulties    Newborn  Information for the patient's newborn:  Kirke ShaggyVeiga, Girl Calen [[id number]][[id number]]  female  breast feeding    O:  A & O x 3, in no apparent distress              VS:  Filed Vitals:   12/29/14 2315 12/29/14 2350 12/30/14 0040 12/30/14 0424  BP: 119/56 117/57 120/54 112/53  Pulse: 100 99 92 68  Temp:  98.8 F (37.1 C) 99 F (37.2 C) 99 F (37.2 C)  TempSrc:  Oral Oral Oral  Resp:  18 18 18   Height:      Weight:        LABS:  Recent Labs  12/29/14 1945 12/30/14 0600  WBC 23.2* 21.2*  HGB 13.3 11.1*  HCT 38.0 32.8*  PLT 230 193    Blood type: O POS (10/28 1945)  Rubella: Immune (04/01 0000)   I&O: I/O last 3 completed shifts: In: 1768.8 [I.V.:1718.8; IV Piggyback:50] Out: 200 [Urine:150; Blood:50]             Lungs: Clear and unlabored  Heart: regular rate and rhythm / no murmurs  Abdomen: soft, non-tender, non-distended             Fundus: firm, non-tender, U-1  Perineum: intact; moderate edema of labia  Lochia: minimal  Extremities: No edema, no calf pain or tenderness, No Homans    A/P: PPD # 1  29 y.o., [id number]   Principal Problem:   Postpartum care following vaginal delivery (10/28) Active Problems:   Pregnancy   Doing well - stable status  Routine post partum orders  Desires early discharge home today    Amiah Frohlich, M, MSN, CNM 12/30/2014, 7:19 AM

## 2015-01-01 ENCOUNTER — Inpatient Hospital Stay (HOSPITAL_COMMUNITY): Admission: RE | Admit: 2015-01-01 | Payer: No Typology Code available for payment source | Source: Ambulatory Visit

## 2015-01-01 ENCOUNTER — Ambulatory Visit: Payer: Self-pay

## 2015-01-01 NOTE — Lactation Note (Signed)
This note was copied from the chart of Shelby Richardson Doppshley Alomar. Lactation Consultation Note  Patient Name: Shelby Turner ZOXWR'UToday's Date: 01/01/2015 Reason for consult: Follow-up assessment   Follow-up consult at 63 hours old; GA 40.3; BW 6 lbs, 2.8 oz.  Infant weight loss 6% at ~50 hrs old at last night's weight check.  Double photo-therapy.  Hx no stool for > 30 hrs.  Mom using DEBP. [facility].  Parents state it was a large brown stools "all up the front and the back." Infant has breastfed x5 (15-20 min) + attempts x4 (0-7 min) + EBM via slow-flow bottle x4 (15-25 ml); voids-6 in 24 hrs/ 14 life; stools-1 in 24 hrs/ 5 life.  Last LS-7. Mom has been pumping with DEBP 15-25 ml as extra EBM supplementation feedings.   Mom states infant has been "uncomfortable" d/t no stools, but infant [facility] and mom states she was about to feed.  Mom did not latch infant until after LC left room.   Mom did not have any questions or concerns at this time.  Mom stated they were eager to get home.   Reviewed engorgement prevention.  Mom states she has a Medela DEBP at home she plans to use after discharge.   Informed mom of hospital support group and outpatient services.  Encouraged mom to call for questions or concerns after discharge.      Maternal Data    Feeding Feeding Type: Breast Fed Nipple Type: Slow - flow Length of feed: 2 min  LATCH Score/Interventions                      Lactation Tools Discussed/Used     Consult Status Consult Status: Complete    Lendon KaVann, Jaimi Belle Walker 01/01/2015, 1:36 PM

## 2015-01-01 NOTE — Lactation Note (Signed)
This note was copied from the chart of Shelby Turner Fukuhara. Lactation Consultation Note Baby fussy w/gas. On DPT. Passing a lot of gas. Hasn't stooled in 24 hrs. Mom filling. Baby has been cluster feeding. RN gave hand pump and bottle to supplement baby and relieve breast. Patient Name: Shelby Turner Seiler ZOXWR'UToday's Date: 01/01/2015 Reason for consult: Follow-up assessment   Maternal Data    Feeding Feeding Type: Breast Fed  LATCH Score/Interventions Latch: Too sleepy or reluctant, no latch achieved, no sucking elicited. (fussy at breast, passing gas ++) Intervention(s): Skin to skin     Type of Nipple: Everted at rest and after stimulation  Comfort (Breast/Nipple): Filling, red/small blisters or bruises, mild/mod discomfort  Problem noted: Mild/Moderate discomfort;Filling Interventions (Filling): Massage;Firm support;Frequent nursing;Hand pump Interventions (Mild/moderate discomfort): Post-pump;Hand massage;Hand expression        Lactation Tools Discussed/Used Tools: Pump Breast pump type: Manual Pump Review: Setup, frequency, and cleaning;Milk Storage Initiated by:: RN/Kat Date initiated:: 01/01/15   Consult Status Consult Status: PRN Date: 01/01/15 Follow-up type: In-patient    Saleha Kalp, Diamond NickelLAURA G 01/01/2015, 1:41 AM

## 2017-03-27 ENCOUNTER — Telehealth: Payer: Self-pay

## 2017-03-27 NOTE — Telephone Encounter (Signed)
Copied from CRM [phone]. Topic: Appointment Scheduling - Prior Auth Required for Appointment >> Mar 27, 2017  9:28 AM Sherrie GeorgeFoltz, Melissa J wrote: No appointment has been scheduled. Patient is requesting appointment. Per scheduling protocol, this appointment requires a prior authorization prior to scheduling.  Route to department's PEC pool. No appt made, pt was referred to Dr Lorin Picketscott by several of her patients., she is a dental hygienist.Many of there patients at the dentist office talk wonders about Dr Lorin PicketScott.   Cb# [phone]

## 2017-03-27 NOTE — Telephone Encounter (Signed)
Lm to call back  and schedule

## 2017-03-27 NOTE — Telephone Encounter (Signed)
Please advise 

## 2017-03-27 NOTE — Telephone Encounter (Signed)
Ok

## 2017-03-27 NOTE — Telephone Encounter (Signed)
Patient would like to have Dr. Lorin PicketScott as her PCP.

## 2017-04-07 ENCOUNTER — Ambulatory Visit (INDEPENDENT_AMBULATORY_CARE_PROVIDER_SITE_OTHER): Payer: 59 | Admitting: Internal Medicine

## 2017-04-07 ENCOUNTER — Encounter: Payer: Self-pay | Admitting: Internal Medicine

## 2017-04-07 VITALS — BP 118/64 | HR 98 | Temp 98.4°F | Resp 16 | Wt 137.8 lb

## 2017-04-07 DIAGNOSIS — K219 Gastro-esophageal reflux disease without esophagitis: Secondary | ICD-10-CM

## 2017-04-07 DIAGNOSIS — F439 Reaction to severe stress, unspecified: Secondary | ICD-10-CM | POA: Diagnosis not present

## 2017-04-07 DIAGNOSIS — R000 Tachycardia, unspecified: Secondary | ICD-10-CM | POA: Insufficient documentation

## 2017-04-07 DIAGNOSIS — Z124 Encounter for screening for malignant neoplasm of cervix: Secondary | ICD-10-CM

## 2017-04-07 DIAGNOSIS — E0789 Other specified disorders of thyroid: Secondary | ICD-10-CM

## 2017-04-07 DIAGNOSIS — R200 Anesthesia of skin: Secondary | ICD-10-CM

## 2017-04-07 LAB — CBC WITH DIFFERENTIAL/PLATELET
BASOS PCT: 0.5 % (ref 0.0–3.0)
Basophils Absolute: 0 10*3/uL (ref 0.0–0.1)
EOS ABS: 0.1 10*3/uL (ref 0.0–0.7)
Eosinophils Relative: 1.5 % (ref 0.0–5.0)
HCT: 40.9 % (ref 36.0–46.0)
HEMOGLOBIN: 14.1 g/dL (ref 12.0–15.0)
Lymphocytes Relative: 28.7 % (ref 12.0–46.0)
Lymphs Abs: 2.7 10*3/uL (ref 0.7–4.0)
MCHC: 34.4 g/dL (ref 30.0–36.0)
MCV: 86.2 fl (ref 78.0–100.0)
MONO ABS: 0.8 10*3/uL (ref 0.1–1.0)
Monocytes Relative: 8.1 % (ref 3.0–12.0)
NEUTROS PCT: 61.2 % (ref 43.0–77.0)
Neutro Abs: 5.7 10*3/uL (ref 1.4–7.7)
PLATELETS: 251 10*3/uL (ref 150.0–400.0)
RBC: 4.75 Mil/uL (ref 3.87–5.11)
RDW: 12.8 % (ref 11.5–15.5)
WBC: 9.3 10*3/uL (ref 4.0–10.5)

## 2017-04-07 LAB — COMPREHENSIVE METABOLIC PANEL
ALBUMIN: 4.3 g/dL (ref 3.5–5.2)
ALT: 13 U/L (ref 0–35)
AST: 12 U/L (ref 0–37)
Alkaline Phosphatase: 43 U/L (ref 39–117)
BUN: 22 mg/dL (ref 6–23)
CHLORIDE: 104 meq/L (ref 96–112)
CO2: 26 meq/L (ref 19–32)
CREATININE: 0.76 mg/dL (ref 0.40–1.20)
Calcium: 9.2 mg/dL (ref 8.4–10.5)
GFR: 93.32 mL/min (ref >60.00)
Glucose, Bld: 89 mg/dL (ref 70–99)
Potassium: 4.3 mEq/L (ref 3.5–5.1)
SODIUM: 138 meq/L (ref 135–145)
Total Bilirubin: 0.5 mg/dL (ref 0.2–1.2)
Total Protein: 7.5 g/dL (ref 6.0–8.3)

## 2017-04-07 LAB — FERRITIN: Ferritin: 117.6 ng/mL (ref 10.0–291.0)

## 2017-04-07 LAB — TSH: TSH: 17.4 u[IU]/mL — ABNORMAL HIGH (ref 0.35–4.50)

## 2017-04-07 NOTE — Patient Instructions (Signed)
Zantac (ranitidine) 150mg - take one tablet 30 minutes before breakfast.   

## 2017-04-07 NOTE — Progress Notes (Signed)
Patient ID: Shelby Turner, female   DOB: Mar 02, 1986, 32 y.o.   MRN: [id number]   Subjective:    Patient ID: Shelby Turner, female    DOB: Mar 02, 1986, 32 y.o.   MRN: [id number]  HPI  Patient here for to establish care.  She is followed by GYN - Dr Rosemary Holmsavon.  She is a Armed forces operational officerdental hygienist.  She reports she has been relatively healthy.  Has two children - two daughters (ages 297 and 2).  Tries to stay active.  She report noticing some hand/finger numbness.  Mostly localized to 4th and 5th fingers of right hand.  No weakness.  Discussed wearing splints.  Has helped some.  Wants to continue.  Has nexplanon.  Periods regular.  Does report some increased stress and possible anxiety.  Discussed with her today.  Has noticed some intermittent episodes of increased heart rate.  States pulse can get up to 120s.  Is intermittent.  States her last episode was two weeks ago.  Lasted approximately 1/2 day.  Heart rate 104-106.  No chest pain.  No actual sob.  States it just feels at times (when occurring), that she cannot get a good deep breath.  She was questioning if she could be anemic.  Noticed when went off pre natal vitamins.  Also was questioning if the increased stress and anxiety could be contributing.  Reports some fatigue.  No symptoms with increased activity or exertion.  Does report some bilateral knee discomfort (that she terms arthritis).  Takes tumeric and L-lysine.  Reports some occasional acid reflux at night.  No abdominal pain.  Bowels moving.  No urine change.  Will notice headache with her period.  Takes ibuprofen.  Relieves.     Past Medical History:  Diagnosis Date  . Hx of varicella   . Postpartum care following vaginal delivery (10/28) 12/30/2014   Past Surgical History:  Procedure Laterality Date  . IUD REMOVAL     implanted in uterus   Family History  Problem Relation Age of Onset  . Kidney Stones Maternal Aunt   . Kidney Stones Maternal Grandfather   . Cancer Maternal Grandfather    basal cell carcinoma  . COPD Paternal Grandfather    Social History   Socioeconomic History  . Marital status: Married    Spouse name: None  . Number of children: None  . Years of education: None  . Highest education level: None  Social Needs  . Financial resource strain: None  . Food insecurity - worry: None  . Food insecurity - inability: None  . Transportation needs - medical: None  . Transportation needs - non-medical: None  Occupational History  . None  Tobacco Use  . Smoking status: Never Smoker  . Smokeless tobacco: Never Used  Substance and Sexual Activity  . Alcohol use: No  . Drug use: No  . Sexual activity: Yes  Other Topics Concern  . None  Social History Narrative  . None    Outpatient Encounter Medications as of 04/07/2017  Medication Sig  . ibuprofen (ADVIL,MOTRIN) 600 MG tablet Take 1 tablet (600 mg total) by mouth every 6 (six) hours.  . Lysine 500 MG CAPS Take 1 capsule by mouth daily.  . Prenatal Vit-Fe Fumarate-FA (PRENATAL MULTIVITAMIN) TABS tablet Take 1 tablet by mouth daily at 12 noon.   No facility-administered encounter medications on file as of 04/07/2017.     Review of Systems  Constitutional: Positive for fatigue. Negative for appetite change and unexpected weight change.  HENT: Negative for congestion and sinus pressure.   Respiratory: Negative for cough, chest tightness and shortness of breath.   Cardiovascular: Negative for chest pain and leg swelling.       Increased heart rate as outlined.  Intermittent episodes.    Gastrointestinal: Negative for abdominal pain, diarrhea, nausea and vomiting.       Some occasional acid reflux.    Genitourinary: Negative for difficulty urinating and dysuria.  Musculoskeletal: Negative for joint swelling and myalgias.  Skin: Negative for color change and rash.  Neurological: Negative for dizziness.       Some headaches associated with her periods.    Hematological: Negative for adenopathy. Does not  bruise/bleed easily.  Psychiatric/Behavioral: Negative for agitation and dysphoric mood.       Some increased stress/anxiety as outlined.        Objective:    Physical Exam  Constitutional: She appears well-developed and well-nourished. No distress.  HENT:  Nose: Nose normal.  Mouth/Throat: Oropharynx is clear and moist.  Eyes: Conjunctivae are normal. Right eye exhibits no discharge. Left eye exhibits no discharge.  Neck: Neck supple.  Question of thyroid fullness on exam. Non tender.   Cardiovascular: Normal rate and regular rhythm.  Pulmonary/Chest: Breath sounds normal. No respiratory distress. She has no wheezes.  Abdominal: Soft. Bowel sounds are normal. There is no tenderness.  Musculoskeletal: She exhibits no edema or tenderness.  Lymphadenopathy:    She has no cervical adenopathy.  Skin: No rash noted. No erythema.  Psychiatric: She has a normal mood and affect. Her behavior is normal.    BP 118/64 (BP Location: Left Arm, Patient Position: Sitting, Cuff Size: Normal)   Pulse 98   Temp 98.4 F (36.9 C) (Oral)   Resp 16   Wt 137 lb 12.8 oz (62.5 kg)   BMI 20.95 kg/m  Wt Readings from Last 3 Encounters:  04/07/17 137 lb 12.8 oz (62.5 kg)  12/29/14 166 lb (75.3 kg)     Lab Results  Component Value Date   WBC 9.3 04/07/2017   HGB 14.1 04/07/2017   HCT 40.9 04/07/2017   PLT 251.0 04/07/2017   GLUCOSE 89 04/07/2017   ALT 13 04/07/2017   AST 12 04/07/2017   NA 138 04/07/2017   K 4.3 04/07/2017   CL 104 04/07/2017   CREATININE 0.76 04/07/2017   BUN 22 04/07/2017   CO2 26 04/07/2017   TSH 17.40 (H) 04/07/2017       Assessment & Plan:   Problem List Items Addressed This Visit    Cervical cancer screening    Sees Dr Rosemary Holms.  Up to date.        GERD (gastroesophageal reflux disease)    Occasional acid reflux as outlined.  Start zantac daily.  F/u soon to reassess.  Discussed avoiding foods that aggravate.  Do not eat just prior to lying down.         Hand numbness    Notices at night.  Uses her hands a lot.  Discussed wrist splints.  Will notify me if worsens.        Stress    Discussed with her today.  Overall doing relatively well.  Follow.  Hold on medication.        Tachycardia - Primary    Describes intermittent episodes.  Unclear as to the exact etiology.  No chest pain.  Does not occur with increased activity.  Discussed stress.  Overall handling things relatively well.  EKG - SR with  no acute ischemic changes.  Treat acid reflux.  Check routine labs including tsh, cbc/ferritin and electrolytes.  Avoid increased caffeine or stimulants.  Hold on further cardiac w/up.  Pt comfortable with this plan.        Relevant Orders   EKG 12-Lead (Completed)   CBC with Differential/Platelet (Completed)   Comprehensive metabolic panel (Completed)   TSH (Completed)   Ferritin (Completed)   Thyroid fullness    Question of thyroid fullness on exam.  Check tsh. Consider thyroid ultrasound.            Dale Baconton, MD

## 2017-04-08 ENCOUNTER — Other Ambulatory Visit: Payer: Self-pay | Admitting: Internal Medicine

## 2017-04-08 DIAGNOSIS — E039 Hypothyroidism, unspecified: Secondary | ICD-10-CM

## 2017-04-08 NOTE — Progress Notes (Signed)
Order placed for f/u tsh.  

## 2017-04-09 ENCOUNTER — Other Ambulatory Visit: Payer: Self-pay | Admitting: Internal Medicine

## 2017-04-09 DIAGNOSIS — R7989 Other specified abnormal findings of blood chemistry: Secondary | ICD-10-CM

## 2017-04-09 DIAGNOSIS — E0789 Other specified disorders of thyroid: Secondary | ICD-10-CM

## 2017-04-09 MED ORDER — LEVOTHYROXINE SODIUM 50 MCG PO TABS: 50.0000 ug | ORAL_TABLET | Freq: Every day | ORAL | 2 refills | Status: DC

## 2017-04-09 NOTE — Progress Notes (Signed)
Order placed for thyroid ultrasound. 

## 2017-04-09 NOTE — Progress Notes (Signed)
rx sent in for synthroid #30 with 2 refills.

## 2017-04-10 ENCOUNTER — Encounter: Payer: Self-pay | Admitting: Internal Medicine

## 2017-04-10 DIAGNOSIS — F439 Reaction to severe stress, unspecified: Secondary | ICD-10-CM | POA: Insufficient documentation

## 2017-04-10 DIAGNOSIS — Z124 Encounter for screening for malignant neoplasm of cervix: Secondary | ICD-10-CM | POA: Insufficient documentation

## 2017-04-10 DIAGNOSIS — E0789 Other specified disorders of thyroid: Secondary | ICD-10-CM | POA: Insufficient documentation

## 2017-04-10 DIAGNOSIS — K219 Gastro-esophageal reflux disease without esophagitis: Secondary | ICD-10-CM | POA: Insufficient documentation

## 2017-04-10 DIAGNOSIS — R200 Anesthesia of skin: Secondary | ICD-10-CM | POA: Insufficient documentation

## 2017-04-10 NOTE — Assessment & Plan Note (Signed)
Sees Dr Rosemary Holmsavon.  Up to date.

## 2017-04-10 NOTE — Assessment & Plan Note (Signed)
Notices at night.  Uses her hands a lot.  Discussed wrist splints.  Will notify me if worsens.

## 2017-04-10 NOTE — Assessment & Plan Note (Signed)
Occasional acid reflux as outlined.  Start zantac daily.  F/u soon to reassess.  Discussed avoiding foods that aggravate.  Do not eat just prior to lying down.

## 2017-04-10 NOTE — Assessment & Plan Note (Addendum)
Describes intermittent episodes.  Unclear as to the exact etiology.  No chest pain.  Does not occur with increased activity.  Discussed stress.  Overall handling things relatively well.  EKG - SR with no acute ischemic changes.  Treat acid reflux.  Check routine labs including tsh, cbc/ferritin and electrolytes.  Avoid increased caffeine or stimulants.  Hold on further cardiac w/up.  Pt comfortable with this plan.

## 2017-04-10 NOTE — Assessment & Plan Note (Signed)
Discussed with her today.  Overall doing relatively well.  Follow.  Hold on medication.

## 2017-04-10 NOTE — Assessment & Plan Note (Signed)
Question of thyroid fullness on exam.  Check tsh. Consider thyroid ultrasound.

## 2017-04-12 ENCOUNTER — Telehealth: Payer: Self-pay | Admitting: Internal Medicine

## 2017-04-12 NOTE — Telephone Encounter (Signed)
-----   Message from Shelby Ibaimothy J Gollan, Shelby Turner sent at 04/10/2017  8:49 AM EST ----- Regarding: RE: review EKG EKG looks normal to me Sometimes if folks are symptomatic, no clear pathology, I use propranolol PRN or if happening daily might use bystolic 2.5 to 5 daily thx TG ----- Message ----- From: Shelby Turner, Shelby Meadow, Shelby Turner Sent: 04/10/2017   4:58 AM To: Shelby Ibaimothy J Gollan, Shelby Turner Subject: review EKG                                     This is a new pt to me.  She is overall healthy.  She reported having intermittent episodes of tachycardia.  (not often, but may last 2 hours to 1/2 day).  Was questioning if stress could be contributing, but overall appears to be managing things well.  Labs unrevealing except elevated tsh (newly diagnosed - hypothyroidism).  I did an EKG.  Do you mind reviewing and letting me know what you think.  I would like to get your opinion.    I appreciate your help.   Shelby Durhamharlene Armari Fussell

## 2017-04-15 ENCOUNTER — Ambulatory Visit: Payer: 59

## 2017-04-17 ENCOUNTER — Ambulatory Visit
Admission: RE | Admit: 2017-04-17 | Discharge: 2017-04-17 | Disposition: A | Discharge status: Home / Self Care | Payer: 59 | Source: Ambulatory Visit | Attending: Internal Medicine | Admitting: Internal Medicine

## 2017-04-17 DIAGNOSIS — R7989 Other specified abnormal findings of blood chemistry: Secondary | ICD-10-CM | POA: Diagnosis not present

## 2017-04-17 DIAGNOSIS — E0789 Other specified disorders of thyroid: Secondary | ICD-10-CM | POA: Insufficient documentation

## 2017-04-17 DIAGNOSIS — E041 Nontoxic single thyroid nodule: Secondary | ICD-10-CM | POA: Insufficient documentation

## 2017-04-23 ENCOUNTER — Telehealth: Payer: Self-pay

## 2017-04-23 NOTE — Telephone Encounter (Signed)
Copied from CRM [phone]. Topic: Quick Communication - Lab Results >> Apr 23, 2017 12:39 PM Waymon AmatoBurton, Donna F wrote: Pt is looking for results from ultrasound from 04/17/17   Best number [phone] >> Apr 23, 2017 12:43 PM Waymon AmatoBurton, Donna F wrote: error

## 2017-04-24 NOTE — Telephone Encounter (Signed)
Not resulted yet. Patient aware.

## 2017-04-27 ENCOUNTER — Other Ambulatory Visit: Payer: Self-pay | Admitting: Internal Medicine

## 2017-04-27 DIAGNOSIS — E041 Nontoxic single thyroid nodule: Secondary | ICD-10-CM | POA: Insufficient documentation

## 2017-04-27 NOTE — Progress Notes (Signed)
Order placed for endocrinology referral.  

## 2017-04-27 NOTE — Telephone Encounter (Signed)
I have spoken to patient and gave her the ultrasound results per Dr.Scott. Patient is ok with referral.

## 2017-05-06 DIAGNOSIS — E041 Nontoxic single thyroid nodule: Secondary | ICD-10-CM | POA: Diagnosis not present

## 2017-05-06 DIAGNOSIS — E039 Hypothyroidism, unspecified: Secondary | ICD-10-CM | POA: Diagnosis not present

## 2017-06-05 ENCOUNTER — Ambulatory Visit: Payer: 59 | Admitting: Internal Medicine

## 2017-06-26 DIAGNOSIS — E039 Hypothyroidism, unspecified: Secondary | ICD-10-CM | POA: Diagnosis not present

## 2017-08-24 ENCOUNTER — Ambulatory Visit: Payer: Self-pay | Admitting: Internal Medicine

## 2017-08-24 NOTE — Telephone Encounter (Signed)
Pt c/o elevated HR (120 bpm during call). Pt stated that she is wondering if her dose increase of Levothyroxine may be contributing to this. Pt states that her HR will be in the 70's to 80's and then will increased to 102. Pt states that HR increases with exertion.  She stated that last week she had a headache every day. Denies chest pain or SOB. Pt states that  she was seen in February (04/07/17) for same issue. Care advice given and appt made for next available appt. Appt made for Thursday am. Advised pt to call if worsens. No availability with PCP. Reason for Disposition . History of hyperthyroidism or taking thyroid medication  Answer Assessment - Initial Assessment Questions 1. DESCRIPTION: "Please describe your heart rate or heart beat that you are having" (e.g., fast/slow, regular/irregular, skipped or extra beats, "palpitations")     Fast heart rate 2. ONSET: "When did it start?" (Minutes, hours or days)      Beginning of June 3. DURATION: "How long does it last" (e.g., seconds, minutes, hours)     Past week and a half HR elevate 108 at time of call 4. PATTERN "Does it come and go, or has it been constant since it started?"  "Does it get worse with exertion?"   "Are you feeling it now?"     Comes and goes-worse with exertion-yes 5. TAP: "Using your hand, can you tap out what you are feeling on a chair or table in front of you, so that I can hear?" (Note: not all patients can do this)       Pt unable to do so  6. HEART RATE: "Can you tell me your heart rate?" "How many beats in 15 seconds?"  (Note: not all patients can do this)       HR 120 at time of call 7. RECURRENT SYMPTOM: "Have you ever had this before?" If so, ask: "When was the last time?" and "What happened that time?"      Yes February and reeval March. Saw endo in April- lab work May and increased levothyroxine from 75 mcg to 88 mcg 8. CAUSE: "What do you think is causing the palpitations?"     Thyroid meds - pt not sure 9.  CARDIAC HISTORY: "Do you have any history of heart disease?" (e.g., heart attack, angina, bypass surgery, angioplasty, arrhythmia)      no 10. OTHER SYMPTOMS: "Do you have any other symptoms?" (e.g., dizziness, chest pain, sweating, difficulty breathing)       Headache daily for the past week 11. PREGNANCY: "Is there any chance you are pregnant?" "When was your last menstrual period?"       No LMP: just finished menses  Protocols used: HEART RATE AND HEARTBEAT QUESTIONS-A-AH

## 2017-08-26 NOTE — Progress Notes (Signed)
Subjective:    Patient ID: Shelby Turner, female    DOB: 1985/04/13, 32 y.o.   MRN: [id number]  HPI  Shelby Turner is a 32 year old female who presents today with intermittent episodes of elevated heart rate. She reports that her HR is typically in the 70s or 80s but can go up to 120 at times. Episodes last for a brief time. She reports that her HR will "jump up and then go right back down" SOB: No Chest Pain: No Increased stress: Recent increase in life and work stressors  History of anxiety: No per patient. She does report that she has had increased stress at home and at work. Water intake: She drinks 6 to 8 water bottles (16 oz) per day. She has stopped her caffeine intake 4 days ago and reports episodes of increased HR can still occur approximately 7 to 10 times a day and last for a few seconds.  Pain: No  Episodes are triggered by discussions with husband related to step son and custody concerns but do occur at other times without obvious trigger.  Alleviating factor: Sitting down and resting  She was evaluated on 04/07/17 for elevated HR and denied chest pain or SOB. She reported that she did feel that she could not get a good breath when elevated HR was occurring. Today, she states that episodes are the same as before Not worsening.   Etiology was unclear; not triggered by activity. Stress reduction discussed and EKG noted SR with no acute ischemic changes. She was advised to avoid caffeine or stimulants. She has decreased caffeine intake and episodes are still occurring.    On 04/07/17, TSH was elevated (17.40) and synthroid was initiated by PCP. She was also advised to follow up for TSH testing. Other lab work CBC, kidney and liver function tests were normal.  Thyroid US noted a nodule and patient is followed by endocrinology. Next follow up Sept. 2019.   Review of Systems  Constitutional: Negative for chills, fatigue and fever.  Respiratory: Negative for cough, shortness of breath  and wheezing.   Cardiovascular: Negative for chest pain, palpitations and leg swelling.       Episodes of palpitations.  Gastrointestinal: Negative for abdominal pain, nausea and vomiting.  Genitourinary: Negative for dysuria.  Musculoskeletal: Negative for myalgias.  Skin: Negative for rash.  Neurological: Negative for dizziness, syncope, weakness, light-headedness and headaches.  Psychiatric/Behavioral:       Denies depression or anxiety   Past Medical History:  Diagnosis Date  . Hx of varicella   . Postpartum care following vaginal delivery (10/28) 12/30/2014     Social History   Socioeconomic History  . Marital status: Married    Spouse name: Not on file  . Number of children: Not on file  . Years of education: Not on file  . Highest education level: Not on file  Occupational History  . Not on file  Social Needs  . Financial resource strain: Not on file  . Food insecurity:    Worry: Not on file    Inability: Not on file  . Transportation needs:    Medical: Not on file    Non-medical: Not on file  Tobacco Use  . Smoking status: Never Smoker  . Smokeless tobacco: Never Used  Substance and Sexual Activity  . Alcohol use: No  . Drug use: No  . Sexual activity: Yes  Lifestyle  . Physical activity:    Days per week: Not on file  Minutes per session: Not on file  . Stress: Not on file  Relationships  . Social connections:    Talks on phone: Not on file    Gets together: Not on file    Attends religious service: Not on file    Active member of club or organization: Not on file    Attends meetings of clubs or organizations: Not on file    Relationship status: Not on file  . Intimate partner violence:    Fear of current or ex partner: Not on file    Emotionally abused: Not on file    Physically abused: Not on file    Forced sexual activity: Not on file  Other Topics Concern  . Not on file  Social History Narrative  . Not on file    Past Surgical History:    Procedure Laterality Date  . IUD REMOVAL     implanted in uterus    Family History  Problem Relation Age of Onset  . Kidney Stones Maternal Aunt   . Kidney Stones Maternal Grandfather   . Cancer Maternal Grandfather        basal cell carcinoma  . COPD Paternal Grandfather     No Known Allergies  Current Outpatient Medications on File Prior to Visit  Medication Sig Dispense Refill  . etonogestrel (NEXPLANON) 68 MG IMPL implant Inject into the skin.    Marland Kitchen ibuprofen (ADVIL,MOTRIN) 600 MG tablet Take 1 tablet (600 mg total) by mouth every 6 (six) hours. 30 tablet 0  . levothyroxine (SYNTHROID, LEVOTHROID) 88 MCG tablet Take by mouth.    . Lysine 500 MG CAPS Take 1 capsule by mouth daily.    . Prenatal Vit-Fe Fumarate-FA (PRENATAL MULTIVITAMIN) TABS tablet Take 1 tablet by mouth daily at 12 noon.     No current facility-administered medications on file prior to visit.     BP 124/62 (BP Location: Left Arm, Patient Position: Sitting, Cuff Size: Normal)   Pulse (!) 102   Temp 98.1 F (36.7 C) (Oral)   Resp 16   Wt 136 lb 8 oz (61.9 kg)   SpO2 100%   BMI 20.75 kg/m       Objective:   Physical Exam  Constitutional: She is oriented to person, place, and time. She appears well-developed and well-nourished.  Eyes: Pupils are equal, round, and reactive to light. No scleral icterus.  Neck: Neck supple.  Cardiovascular: Normal heart sounds and intact distal pulses.  Pulmonary/Chest: Effort normal and breath sounds normal. She has no wheezes. She has no rales.  Abdominal: Soft. Bowel sounds are normal. There is no tenderness.  Lymphadenopathy:    She has no cervical adenopathy.  Neurological: She is alert and oriented to person, place, and time.  Skin: Skin is warm and dry. Capillary refill takes less than 2 seconds. No rash noted.  Psychiatric:  Appears anxious; speaking quickly during exam      Assessment & Plan:  1. Tachycardia Intermittent palpitations. HR only got to 102  when checked during this episode. She does have some tendency toward anxiety symptoms that she described with home life and work which could be exacerbating. Advised continued avoidance of caffeine and focus on hydration with water. Prior CBC, electrolytes, and ferritin results WNL on 04/07/17.  She politely declined EKG today. Last EKG on 04/07/17 noted SR with no acute ischemic changes. Follow up with endocrinology for TSH and medication management as planned.  She would like a referral to cardiology to evaluate need  for event monitoring/Echo. Referral placed.  Close follow up precautions if any increasing tachycardia symptoms, dizziness, or syncope.    - Ambulatory referral to Cardiology  Roddie Mc, FNP-C

## 2017-08-27 ENCOUNTER — Encounter: Payer: Self-pay | Admitting: Family Medicine

## 2017-08-27 ENCOUNTER — Ambulatory Visit (INDEPENDENT_AMBULATORY_CARE_PROVIDER_SITE_OTHER): Payer: 59 | Admitting: Family Medicine

## 2017-08-27 VITALS — BP 124/62 | HR 102 | Temp 98.1°F | Resp 16 | Wt 136.5 lb

## 2017-08-27 DIAGNOSIS — R000 Tachycardia, unspecified: Secondary | ICD-10-CM

## 2017-08-27 NOTE — Patient Instructions (Signed)
It was a pleasure to see you today! Please avoid caffeine. Remain hydrated by drinking water Try to identify triggers if possible   Sinus Tachycardia Sinus tachycardia is a kind of fast heartbeat. In sinus tachycardia, the heart beats more than 100 times a minute. Sinus tachycardia starts in a part of the heart called the sinus node. Sinus tachycardia may be harmless, or it may be a sign of a serious condition. What are the causes? This condition may be caused by:  Exercise or exertion.  A fever.  Pain.  Loss of body fluids (dehydration).  Severe bleeding (hemorrhage).  Anxiety and stress.  Certain substances, including: ? Alcohol. ? Caffeine. ? Tobacco and nicotine products. ? Diet pills. ? Illegal drugs.  Medical conditions including: ? Heart disease. ? An infection. ? An overactive thyroid (hyperthyroidism). ? A lack of red blood cells (anemia).  What are the signs or symptoms? Symptoms of this condition include:  A feeling that the heart is beating quickly (palpitations).  Suddenly noticing your heartbeat (cardiac awareness).  Dizziness.  Tiredness (fatigue).  Shortness of breath.  Chest pain.  Nausea.  Fainting.  How is this diagnosed? This condition is diagnosed with:  A physical exam.  Other tests, such as: ? Blood tests. ? An electrocardiogram (ECG). This test measures the electrical activity of the heart. ? Holter monitoring. For this test, you wear a device that records your heartbeat for one or more days.  You may be referred to a heart specialist (cardiologist). How is this treated? Treatment for this condition depends on the cause or underlying condition. Treatment may involve:  Treating the underlying condition.  Taking new medicines or changing your current medicines as told by your health care provider.  Making changes to your diet or lifestyle.  Practicing relaxation methods.  Follow these instructions at  home: Lifestyle  Do not use any products that contain nicotine or tobacco, such as cigarettes and e-cigarettes. If you need help quitting, ask your health care provider.  Learn relaxation methods, like deep breathing, to help you when you get stressed or anxious.  Do not use illegal drugs, such as cocaine.  Do not abuse alcohol. Limit alcohol intake to no more than 1 drink a day for non-pregnant women and 2 drinks a day for men. One drink equals 12 oz of beer, 5 oz of wine, or 1 oz of hard liquor.  Find time to rest and relax often. This reduces stress.  Avoid: ? Caffeine. ? Stimulants such as over-the-counter diet pills or pills that help you to stay awake. ? Situations that cause anxiety or stress. General instructions  Drink enough fluids to keep your urine clear or pale yellow.  Take over-the-counter and prescription medicines only as told by your health care provider.  Keep all follow-up visits as told by your health care provider. This is important. Contact a health care provider if:  You have a fever.  You have vomiting or diarrhea that keeps happening (is persistent). Get help right away if:  You have pain in your chest, upper arms, jaw, or neck.  You become weak or dizzy.  You feel faint.  You have palpitations that do not go away. This information is not intended to replace advice given to you by your health care provider. Make sure you discuss any questions you have with your health care provider. Document Released: 03/27/2004 Document Revised: 09/15/2015 Document Reviewed: 09/01/2014 Elsevier Interactive Patient Education  [employer]2018 Elsevier Inc[employer]

## 2017-11-06 DIAGNOSIS — E041 Nontoxic single thyroid nodule: Secondary | ICD-10-CM | POA: Diagnosis not present

## 2017-11-06 DIAGNOSIS — E039 Hypothyroidism, unspecified: Secondary | ICD-10-CM | POA: Diagnosis not present

## 2017-11-13 DIAGNOSIS — E039 Hypothyroidism, unspecified: Secondary | ICD-10-CM | POA: Diagnosis not present

## 2017-11-13 DIAGNOSIS — E042 Nontoxic multinodular goiter: Secondary | ICD-10-CM | POA: Diagnosis not present

## 2017-11-19 DIAGNOSIS — I479 Paroxysmal tachycardia, unspecified: Secondary | ICD-10-CM | POA: Insufficient documentation

## 2017-11-19 NOTE — Progress Notes (Signed)
Cardiology Office Note  Date:  11/20/2017   ID:  SONAKSHI ROLLAND, DOB 1985-03-20, MRN [id number]  PCP:  Dale Falmouth [place], MD   Chief Complaint  Patient presents with  . OTHER    Irregular heart beat. Meds reviewed verbally with pt.    HPI:  Ms. Shelby Turner is a 32 year old woman with past medical history of anxiety Referred by Arnoldo Hooker for consultation of her Tachycardia  She reports that symptoms of tachycardia started approximately 1 year ago  HR is typically in the 70s or 80s  Acutely without warning will seem to jump up and then go back down Episodes are brief, typically symptomatic,  Sometimes associated with stress or anxiety Other times no precipitating causes  Denies having shortness of breath or chest discomfort when she has episodes Significant family work stress 2 children /girls Works in a dental office  EKG personally reviewed by myself on todays visit Shows normal sinus rhythm rate 83 bpm no significant ST-T wave changes  PMH:   has a past medical history of varicella, Postpartum care following vaginal delivery (10/28) (12/30/2014), and Thyroid disease.  PSH:    Past Surgical History:  Procedure Laterality Date  . IUD REMOVAL     implanted in uterus    Current Outpatient Medications  Medication Sig Dispense Refill  . etonogestrel (NEXPLANON) 68 MG IMPL implant Inject into the skin.    Marland Kitchen ibuprofen (ADVIL,MOTRIN) 600 MG tablet Take 1 tablet (600 mg total) by mouth every 6 (six) hours. 30 tablet 0  . levothyroxine (SYNTHROID, LEVOTHROID) 100 MCG tablet Take 100 mcg by mouth daily before breakfast.    . Lysine 500 MG CAPS Take 1 capsule by mouth daily.    . Prenatal Vit-Fe Fumarate-FA (PRENATAL MULTIVITAMIN) TABS tablet Take 1 tablet by mouth daily at 12 noon.    . ranitidine (ZANTAC) 75 MG tablet Take 75 mg by mouth as needed.      No current facility-administered medications for this visit.      Allergies:   Patient has no known  allergies.   Social History:  The patient  reports that she has never smoked. She has never used smokeless tobacco. She reports that she does not drink alcohol or use drugs.   Family History:   family history includes COPD in her paternal grandfather; Cancer in her maternal grandfather; Heart Problems in her maternal grandfather; Kidney Stones in her maternal aunt and maternal grandfather.    Review of Systems: Review of Systems  Constitutional: Negative.   Respiratory: Negative.   Cardiovascular: Positive for palpitations.       Tachycardia  Gastrointestinal: Negative.   Musculoskeletal: Negative.   Neurological: Negative.   Psychiatric/Behavioral: The patient is nervous/anxious.   All other systems reviewed and are negative.    PHYSICAL EXAM: VS:  BP 104/62 (BP Location: Right Arm, Patient Position: Sitting, Cuff Size: Normal)   Pulse 83   Ht 5\' 7"  (1.702 m)   Wt 134 lb 4 oz (60.9 kg)   BMI 21.03 kg/m  , BMI Body mass index is 21.03 kg/m. GEN: Well nourished, well developed, in no acute distress  HEENT: normal  Neck: no JVD, carotid bruits, or masses Cardiac: RRR; no murmurs, rubs, or gallops,no edema  Respiratory:  clear to auscultation bilaterally, normal work of breathing GI: soft, nontender, nondistended, + BS MS: no deformity or atrophy  Skin: warm and dry, no rash Neuro:  Strength and sensation are intact Psych: euthymic mood, full affect  Recent Labs: 04/07/2017: ALT 13; BUN 22; Creatinine, Ser 0.76; Hemoglobin 14.1; Platelets 251.0; Potassium 4.3; Sodium 138; TSH 17.40    Lipid Panel No results found for: CHOL, HDL, LDLCALC, TRIG    Wt Readings from Last 3 Encounters:  11/20/17 134 lb 4 oz (60.9 kg)  08/27/17 136 lb 8 oz (61.9 kg)  04/07/17 137 lb 12.8 oz (62.5 kg)       ASSESSMENT AND PLAN:  Paroxysmal tachycardia (HCC) - Plan: EKG 12-Lead Etiology of her symptoms is unclear,  Discussed various types of arrhythmia with her Options include  sinus tachycardia, atrial tachycardia Less likely SVT, atrial flutter Discussed various treatment options with her EKG and clinical exam is benign Recommended a 2-week Zio Monitor for further monitoring and evaluation We did discuss beta-blockers She would like to wait on starting any medication  Stress Managed by primary care Busy family life, children, work life Potentially could contribute to tachycardia  Disposition:   F/U as needed   Total encounter time more than 60 minutes  Greater than 50% was spent in counseling and coordination of care with the patient  Patient was seen in consultation for Dr. Lorin PicketScott and will be referred back to her office for ongoing care of the issues detailed above   Orders Placed This Encounter  Procedures  . EKG 12-Lead     Signed, Dossie Arbourim Gollan, M.D., Ph.D. 11/20/2017  [facility] [place] [phone]

## 2017-11-20 ENCOUNTER — Ambulatory Visit (INDEPENDENT_AMBULATORY_CARE_PROVIDER_SITE_OTHER): Payer: 59 | Admitting: Cardiovascular Disease

## 2017-11-20 ENCOUNTER — Ambulatory Visit (INDEPENDENT_AMBULATORY_CARE_PROVIDER_SITE_OTHER): Payer: 59

## 2017-11-20 ENCOUNTER — Encounter: Payer: Self-pay | Admitting: Cardiovascular Disease

## 2017-11-20 VITALS — BP 104/62 | HR 83 | Ht 67.0 in | Wt 134.2 lb

## 2017-11-20 DIAGNOSIS — F439 Reaction to severe stress, unspecified: Secondary | ICD-10-CM

## 2017-11-20 DIAGNOSIS — I479 Paroxysmal tachycardia, unspecified: Secondary | ICD-10-CM | POA: Diagnosis not present

## 2017-11-20 NOTE — Patient Instructions (Addendum)
Medication Instructions:   No medication changes made  Labwork:  No new labs needed  Testing/Procedures:  We will place a zio patch for tachycardia episodes  ZIO #- [id number]   Follow-Up: It was a pleasure seeing you in the office today. Please call us if you have new issues that need to be addressed before your next appt.  [phone]  Your physician wants you to follow-up in:  As needed  If you need a refill on your cardiac medications before your next appointment, please call your pharmacy.  For educational health videos Log in to : [email or web address] Or : [email or web address], password : [email or web address]

## 2017-12-10 DIAGNOSIS — I479 Paroxysmal tachycardia, unspecified: Secondary | ICD-10-CM | POA: Diagnosis not present

## 2017-12-21 ENCOUNTER — Telehealth: Payer: Self-pay | Admitting: Cardiovascular Disease

## 2017-12-21 NOTE — Telephone Encounter (Signed)
Notes recorded by Antonieta Iba, MD on 12/19/2017 at 2:09 PM EDT Monitor No significant arrhythmia noted, There is sinus tachycardia (normal rhythm running faster) If having symptoms, could try b-blocker as needed (propranolol 10 mg TID PRN) Or once a day, metoprolol succinate 12.5 daily Would need very low doses given low BP    I left a detailed message of results on the patient's identified voice mail and asked that she call back to discuss if she would like to try a beta blocker at this time.

## 2017-12-24 MED ORDER — PROPRANOLOL HCL 20 MG PO TABS: 20.0000 mg | ORAL_TABLET | Freq: Three times a day (TID) | ORAL | 3 refills | Status: DC | PRN

## 2017-12-24 NOTE — Telephone Encounter (Signed)
Spoke with patient and reviewed results and recommendations with her in detail. She verbalized understanding of options and was agreeable to try propranolol as needed. Will send this in to her pharmacy and she verbalized understanding to monitor blood pressures and to call if any further questions.

## 2018-01-08 DIAGNOSIS — Z6821 Body mass index (BMI) 21.0-21.9, adult: Secondary | ICD-10-CM | POA: Diagnosis not present

## 2018-01-08 DIAGNOSIS — Z01419 Encounter for gynecological examination (general) (routine) without abnormal findings: Secondary | ICD-10-CM | POA: Diagnosis not present

## 2018-01-26 DIAGNOSIS — Z3049 Encounter for surveillance of other contraceptives: Secondary | ICD-10-CM | POA: Diagnosis not present

## 2018-01-26 DIAGNOSIS — Z30017 Encounter for initial prescription of implantable subdermal contraceptive: Secondary | ICD-10-CM | POA: Diagnosis not present

## 2018-02-22 DIAGNOSIS — Z3046 Encounter for surveillance of implantable subdermal contraceptive: Secondary | ICD-10-CM | POA: Diagnosis not present

## 2018-03-28 ENCOUNTER — Other Ambulatory Visit: Payer: Self-pay | Admitting: Cardiovascular Disease

## 2018-05-07 DIAGNOSIS — E039 Hypothyroidism, unspecified: Secondary | ICD-10-CM | POA: Diagnosis not present

## 2018-05-14 DIAGNOSIS — E042 Nontoxic multinodular goiter: Secondary | ICD-10-CM | POA: Diagnosis not present

## 2018-05-14 DIAGNOSIS — E039 Hypothyroidism, unspecified: Secondary | ICD-10-CM | POA: Diagnosis not present

## 2020-08-29 LAB — HM PAP SMEAR: HPV, high-risk: NEGATIVE

## 2020-12-24 ENCOUNTER — Telehealth: Payer: Self-pay | Admitting: Internal Medicine

## 2020-12-24 NOTE — Telephone Encounter (Signed)
Patient informed, Due to the high volume of calls and your symptoms we have to forward your call to our Triage Nurse to expedient your call. Please hold for the transfer.  Patient transferred to [facility] at Access Nurse. Due [place]She was seen by the paramedics and they ran an EKG but told her to see her primary doctor.No openings in office or virtual for the patient to be evaluated.

## 2020-12-24 NOTE — Telephone Encounter (Signed)
Shelby Turner returned the call stating that on 12/22/20 around 4pm pt started to have a high pulse and left [place]. Pt states that while walking she lost the sensation to tell if she actually stepped down with her foot. Pt only felt left side tingling that travelled up from her foot to her face. Pt states that she could still move her foot and hand but both were tingling and her face felt cold. Pt states that it lasted for around 10 minutes and that she checked her pulse and did an EKG through her smart watch. Pt pulse was ranging from 160-171 and her watch showed that she was in AFIB. Pt went to the fairs Paramedic team and was evaluated. They took her Blood pressure and her blood sugar and both were normal. Pt was evaluated for a stroke by the paramedic team and they said she was normal. Pt reported no dizziness or lightheadedness at this time. Pt had an EKG done on site and states that they had bouts where it suddenly spiked or dropped but it was very brief and the EKG came out normal. Pt was not taken to the hospital as her evaluation was normal despite the tingling sensation. Pt states that when her left side was numb, she had no SOB or chest discomfort and had full range of motion. Pt states that today her BP is 135/82 and her pulse is between 90-100. Pt has not been seen since 2019 and scheduled a follow up with Dr. French Ana on 12/26/20.

## 2020-12-24 NOTE — Telephone Encounter (Signed)
I saw her 04/2017.  It appears she saw Dr Mariah Milling for increased heart rate.  Given the left side sensation and question of feeling with walking, I feel she needs to be evaluated to confirm nothing more acute going on.  Recommend evaluation today.

## 2020-12-24 NOTE — Telephone Encounter (Signed)
Left message for patient to return call back.  Patient did schedule an appointment to be evaluated with Dr French Ana 10-/26.

## 2020-12-24 NOTE — Telephone Encounter (Signed)
Called to speak with Shelby Turner again and recommend that she go for an evaluation. Pt states that she can not go today as she has to stay and watch her kids as her husband is working a later shift. Pt gave more information and states that she has not seen Dr. Mariah Milling since 2020 as he believed her heart rate was due to Anxiety. Pt was placed on a heart monitor and they found no irregularities. Pt did have small bouts of rapid spike of bpm but it returned to normal before it registered as abnormal. Pt was instructed that she does not need to see Dr. Mariah Milling again for this. Pt now only sees her OBGYN once yearly and her endocrinologist twice yearly and has no issues currently to discuss. Pt will continue to monitor and states that if she feels the tingling sensation again she will go for an evaluation.

## 2020-12-24 NOTE — Telephone Encounter (Signed)
Left message for patient to return call back. Need a little more information regarding patient sx.

## 2020-12-26 ENCOUNTER — Encounter: Payer: Self-pay | Admitting: Internal Medicine

## 2020-12-26 ENCOUNTER — Other Ambulatory Visit: Payer: Self-pay

## 2020-12-26 ENCOUNTER — Ambulatory Visit: Payer: 59 | Admitting: Internal Medicine

## 2020-12-26 VITALS — BP 124/78 | HR 95 | Temp 97.7°F | Ht 65.83 in | Wt 132.8 lb

## 2020-12-26 DIAGNOSIS — E538 Deficiency of other specified B group vitamins: Secondary | ICD-10-CM | POA: Diagnosis not present

## 2020-12-26 DIAGNOSIS — R000 Tachycardia, unspecified: Secondary | ICD-10-CM

## 2020-12-26 DIAGNOSIS — F419 Anxiety disorder, unspecified: Secondary | ICD-10-CM

## 2020-12-26 DIAGNOSIS — R200 Anesthesia of skin: Secondary | ICD-10-CM

## 2020-12-26 DIAGNOSIS — F439 Reaction to severe stress, unspecified: Secondary | ICD-10-CM | POA: Diagnosis not present

## 2020-12-26 DIAGNOSIS — R202 Paresthesia of skin: Secondary | ICD-10-CM

## 2020-12-26 DIAGNOSIS — E559 Vitamin D deficiency, unspecified: Secondary | ICD-10-CM | POA: Diagnosis not present

## 2020-12-26 DIAGNOSIS — R42 Dizziness and giddiness: Secondary | ICD-10-CM

## 2020-12-26 DIAGNOSIS — E611 Iron deficiency: Secondary | ICD-10-CM

## 2020-12-26 NOTE — Patient Instructions (Addendum)
Therapy consider [facility]  [facility] [phone] Not available [address]   [address]   [place] [address]      Specialties      Paresthesia Paresthesia is an abnormal burning or prickling sensation. It is usually felt in the hands, arms, legs, or feet. However, it may occur in any part of the body. Usually, paresthesia is not painful. It may feel like: Tingling or numbness. Buzzing. Itching. Paresthesia may occur without any clear cause, or it may be caused by: Breathing too quickly (hyperventilation). Pressure on a nerve. An underlying medical condition. Side effects of a medication. Nutritional deficiencies. Exposure to toxic chemicals. Most people experience temporary (transient) paresthesia at some time in their lives. For some people, it may be long-lasting (chronic) because of an underlying medical condition. If you have paresthesia that lasts a long time, you need to be evaluated by your health care provider. Follow these instructions at home: Alcohol use  Do not drink alcohol if: Your health care provider tells you not to drink. You are pregnant, may be pregnant, or are planning to become pregnant. If you drink alcohol: Limit how much you use to: 0-1 drink a day for women. 0-2 drinks a day for men. Be aware of how much alcohol is in your drink. In the [place] one drink equals one 12 oz bottle of beer (355 mL), one 5 oz glass of wine (148 mL), or one 1 oz glass of hard liquor (44 mL). Nutrition  Eat a healthy diet. This includes: Eating foods that are high in fiber, such as fresh fruits and vegetables, whole grains, and beans. Limiting foods that are high in fat and processed sugars, such as fried or sweet foods. General instructions Take over-the-counter and prescription medicines only as told by your health care provider. Do not use any products that contain nicotine or tobacco, such as  cigarettes and e-cigarettes. These can keep blood from reaching damaged nerves. If you need help quitting, ask your health care provider. If you have diabetes, work closely with your health care provider to keep your blood sugar under control. If you have numbness in your feet: Check every day for signs of injury or infection. Watch for redness, warmth, and swelling. Wear padded socks and comfortable shoes. These help protect your feet. Keep all follow-up visits as told by your health care provider. This is important. Contact a health care provider if you: Have paresthesia that gets worse or does not go away. Have numbness after an injury. Have a burning or prickling feeling that gets worse when you walk. Have pain, cramps, or dizziness, or you faint. Develop a rash. Get help right away if you: Feel muscle weakness. Develop new weakness in an arm or leg. Have trouble walking or moving. Have problems with speech, understanding, or vision. Feel confused. Cannot control your bladder or bowel movements. Summary Paresthesia is an abnormal burning or prickling sensation that is usually felt in the hands, arms, legs, or feet. It may also occur in other parts of the body. Paresthesia may occur without any clear cause, or it may be caused by breathing too quickly (hyperventilation), pressure on a nerve, an underlying medical condition, side effects of a medication, nutritional deficiencies, or exposure to toxic chemicals. If you have paresthesia that lasts a long time, you need to be evaluated by your health care provider. This information is not intended to replace advice given to you by your  health care provider. Make sure you discuss any questions you have with your health care provider. Document Revised: 11/29/2019 Document Reviewed: 11/29/2019 Elsevier Patient Education  2022 Elsevier Inc.  Sinus Tachycardia Sinus tachycardia is a kind of fast heartbeat. In sinus tachycardia, the heart beats  more than 100 times a minute. Sinus tachycardia starts in a part of the heart called the sinus node. Sinus tachycardia may be harmless, or it may be a sign of a serious condition. What are the causes? This condition may be caused by: Exercise or exertion. A fever. Pain. Loss of body fluids (dehydration). Severe bleeding (hemorrhage). Anxiety and stress. Certain substances, including: Alcohol. Caffeine. Tobacco and nicotine products. Cold medicines. Illegal drugs. Medical conditions including: Heart disease. An infection. An overactive thyroid (hyperthyroidism). A lack of red blood cells (anemia). What are the signs or symptoms? Symptoms of this condition include: A feeling that the heart is beating quickly (palpitations). Suddenly noticing your heartbeat (cardiac awareness). Dizziness. Tiredness (fatigue). Shortness of breath. Chest pain. Nausea. Fainting. How is this diagnosed? This condition is diagnosed with: A physical exam. Other tests, such as: Blood tests. An electrocardiogram (ECG). This test measures the electrical activity of the heart. Ambulatory cardiac monitor. This records your heartbeats for 24 hours or more. You may be referred to a heart specialist (cardiologist). How is this treated? Treatment for this condition depends on the cause or the underlying condition. Treatment may involve: Treating the underlying condition. Taking new medicines or changing your current medicines as told by your health care provider. Making changes to your diet or lifestyle. Follow these instructions at home: Lifestyle  Do not use any products that contain nicotine or tobacco, such as cigarettes and e-cigarettes. If you need help quitting, ask your health care provider. Do not use illegal drugs, such as cocaine. Learn relaxation methods to help you when you get stressed or anxious. These include deep breathing. Avoid caffeine or other stimulants. Alcohol use  Do not  drink alcohol if: Your health care provider tells you not to drink. You are pregnant, may be pregnant, or are planning to become pregnant. If you drink alcohol, limit how much you have: 0-1 drink a day for women. 0-2 drinks a day for men. Be aware of how much alcohol is in your drink. In the [place] one drink equals one typical bottle of beer (12 oz), one-half glass of wine (5 oz), or one shot of hard liquor (1 oz). General instructions Drink enough fluids to keep your urine pale yellow. Take over-the-counter and prescription medicines only as told by your health care provider. Keep all follow-up visits as told by your health care provider. This is important. Contact a health care provider if you have: A fever. Vomiting or diarrhea that does not go away. Get help right away if you: Have pain in your chest, upper arms, jaw, or neck. Become weak or dizzy. Feel faint. Have palpitations that do not go away. Summary In sinus tachycardia, the heart beats more than 100 times a minute. Sinus tachycardia may be harmless, or it may be a sign of a serious condition. Treatment for this condition depends on the cause or the underlying condition. Get help right away if you have pain in your chest, upper arms, jaw, or neck. This information is not intended to replace advice given to you by your health care provider. Make sure you discuss any questions you have with your health care provider. Document Revised: 04/08/2017 Document Reviewed: 04/08/2017 Elsevier Patient Education  2022 Elsevier Inc.  

## 2020-12-27 LAB — CBC WITH DIFFERENTIAL/PLATELET
Basophils Absolute: 0.1 10*3/uL (ref 0.0–0.1)
Basophils Relative: 1.1 % (ref 0.0–3.0)
Eosinophils Absolute: 0.2 10*3/uL (ref 0.0–0.7)
Eosinophils Relative: 1.9 % (ref 0.0–5.0)
HCT: 39 % (ref 36.0–46.0)
Hemoglobin: 13.2 g/dL (ref 12.0–15.0)
Lymphocytes Relative: 25.6 % (ref 12.0–46.0)
Lymphs Abs: 2.8 10*3/uL (ref 0.7–4.0)
MCHC: 33.8 g/dL (ref 30.0–36.0)
MCV: 87.7 fl (ref 78.0–100.0)
Monocytes Absolute: 0.7 10*3/uL (ref 0.1–1.0)
Monocytes Relative: 6.6 % (ref 3.0–12.0)
Neutro Abs: 7 10*3/uL (ref 1.4–7.7)
Neutrophils Relative %: 64.8 % (ref 43.0–77.0)
Platelets: 271 10*3/uL (ref 150.0–400.0)
RBC: 4.44 Mil/uL (ref 3.87–5.11)
RDW: 12.8 % (ref 11.5–15.5)
WBC: 10.8 10*3/uL — ABNORMAL HIGH (ref 4.0–10.5)

## 2020-12-27 LAB — COMPREHENSIVE METABOLIC PANEL
ALT: 13 U/L (ref 0–35)
AST: 12 U/L (ref 0–37)
Albumin: 4.6 g/dL (ref 3.5–5.2)
Alkaline Phosphatase: 51 U/L (ref 39–117)
BUN: 16 mg/dL (ref 6–23)
CO2: 26 mEq/L (ref 19–32)
Calcium: 9.5 mg/dL (ref 8.4–10.5)
Chloride: 104 mEq/L (ref 96–112)
Creatinine, Ser: 0.82 mg/dL (ref 0.40–1.20)
GFR: 92.02 mL/min (ref >60.00)
Glucose, Bld: 85 mg/dL (ref 70–99)
Potassium: 3.7 mEq/L (ref 3.5–5.1)
Sodium: 137 mEq/L (ref 135–145)
Total Bilirubin: 0.3 mg/dL (ref 0.2–1.2)
Total Protein: 7.7 g/dL (ref 6.0–8.3)

## 2020-12-27 LAB — IRON,TIBC AND FERRITIN PANEL
%SAT: 14 % (calc) — ABNORMAL LOW (ref 16–45)
Ferritin: 91 ng/mL (ref 16–154)
Iron: 46 ug/dL (ref 40–190)
TIBC: 334 mcg/dL (calc) (ref 250–450)

## 2020-12-27 LAB — FOLATE: Folate: >23.4 ng/mL (ref >5.9)

## 2020-12-27 LAB — VITAMIN D 25 HYDROXY (VIT D DEFICIENCY, FRACTURES): VITD: 50.59 ng/mL (ref 30.00–100.00)

## 2020-12-27 LAB — VITAMIN B12: Vitamin B-12: >1550 pg/mL — ABNORMAL HIGH (ref 211–911)

## 2020-12-31 ENCOUNTER — Encounter: Payer: Self-pay | Admitting: Internal Medicine

## 2020-12-31 DIAGNOSIS — F419 Anxiety disorder, unspecified: Secondary | ICD-10-CM | POA: Insufficient documentation

## 2020-12-31 NOTE — Addendum Note (Signed)
Addended by: Quentin Ore on: 12/31/2020 10:34 AM   Modules accepted: Orders

## 2020-12-31 NOTE — Progress Notes (Signed)
Chief Complaint  Patient presents with   Follow-up   Acute visit  [place]zy with tingling in left foot radiating left side arm and face and heart was racing 160 to 171 bpm and felt cold and clammy and sweating. [place] and EKG with sinus tachycardia she no longer takes propranolol 20 mg prn given by Dr. Mariah Milling in the past. During this episode she also felt her balance was off and left foot was asleep. Denies h/a during episode but only has h/o with cycle  She take B12 SL 1000 mg qd tsh normal 05/04/20 endocrine 1.954 on levo 100 mcg qd  2. She reports significant anxiety and stress mostly due to stress of marriage phq 9 score 5 and gad 7 13 will disc with PCP in the future. Husband does not want her to spend time with family or go anywhere w/o him and even when took a girls trip for sisters marriage celebration he listed house and other items for sale   Review of Systems  Constitutional:  Negative for weight loss.  HENT:  Negative for hearing loss.   Eyes:  Negative for blurred vision.  Cardiovascular:  Positive for palpitations.  Skin:  Negative for rash.  Neurological:  Positive for tingling and sensory change. Negative for headaches.  Psychiatric/Behavioral:  The patient is nervous/anxious.   Past Medical History:  Diagnosis Date   Carpal tunnel syndrome, bilateral    Hx of varicella    Postpartum care following vaginal delivery (10/28) 12/30/2014   Thyroid disease    Past Surgical History:  Procedure Laterality Date   IUD REMOVAL     implanted in uterus   Family History  Problem Relation Age of Onset   Kidney Stones Maternal Aunt    Kidney Stones Maternal Grandfather    Cancer Maternal Grandfather        basal cell carcinoma   Heart Problems Maternal Grandfather    COPD Paternal Grandfather    Social History   Socioeconomic History   Marital status: Married    Spouse name: Not on file   Number  of children: Not on file   Years of education: Not on file   Highest education level: Not on file  Occupational History   Not on file  Tobacco Use   Smoking status: Never   Smokeless tobacco: Never  Substance and Sexual Activity   Alcohol use: No   Drug use: No   Sexual activity: Yes  Other Topics Concern   Not on file  Social History Narrative   Not on file   Social Determinants of Health   Financial Resource Strain: Not on file  Food Insecurity: Not on file  Transportation Needs: Not on file  Physical Activity: Not on file  Stress: Not on file  Social Connections: Not on file  Intimate Partner Violence: Not on file   Current Meds  Medication Sig   Cetirizine HCl 10 MG CAPS    cholecalciferol (VITAMIN D3) 25 MCG (1000 UNIT) tablet    Cyanocobalamin (VITAMIN B 12) 500 MCG TABS    etonogestrel (NEXPLANON) 68 MG IMPL implant Inject into the skin.   ibuprofen (ADVIL,MOTRIN) 600 MG tablet Take 1 tablet (600 mg total) by mouth every 6 (six) hours.   L-Theanine 200 MG CAPS    levothyroxine (SYNTHROID, LEVOTHROID) 100 MCG tablet Take 100 mcg by mouth daily before breakfast.   Lysine 500 MG  CAPS Take 1 capsule by mouth daily.   Multiple Vitamins-Minerals (WOMENS MULTI GUMMIES PO)    ranitidine (ZANTAC) 75 MG tablet Take 75 mg by mouth as needed.    No Known Allergies Recent Results (from the past 2160 hour(s))  Comprehensive metabolic panel     Status: None   Collection Time: 12/26/20  3:34 PM  Result Value Ref Range   Sodium 137 135 - 145 mEq/L   Potassium 3.7 3.5 - 5.1 mEq/L   Chloride 104 96 - 112 mEq/L   CO2 26 19 - 32 mEq/L   Glucose, Bld 85 70 - 99 mg/dL   BUN 16 6 - 23 mg/dL   Creatinine, Ser 8.18 0.40 - 1.20 mg/dL   Total Bilirubin 0.3 0.2 - 1.2 mg/dL   Alkaline Phosphatase 51 39 - 117 U/L   AST 12 0 - 37 U/L   ALT 13 0 - 35 U/L   Total Protein 7.7 6.0 - 8.3 g/dL   Albumin 4.6 3.5 - 5.2 g/dL   GFR 29.93 >71.69 mL/min    Comment: Calculated using the CKD-EPI  Creatinine Equation (2021)   Calcium 9.5 8.4 - 10.5 mg/dL  CBC with Differential/Platelet     Status: Abnormal   Collection Time: 12/26/20  3:34 PM  Result Value Ref Range   WBC 10.8 (H) 4.0 - 10.5 K/uL   RBC 4.44 3.87 - 5.11 Mil/uL   Hemoglobin 13.2 12.0 - 15.0 g/dL   HCT 67.8 93.8 - 10.1 %   MCV 87.7 78.0 - 100.0 fl   MCHC 33.8 30.0 - 36.0 g/dL   RDW 75.1 02.5 - 85.2 %   Platelets 271.0 150.0 - 400.0 K/uL   Neutrophils Relative % 64.8 43.0 - 77.0 %   Lymphocytes Relative 25.6 12.0 - 46.0 %   Monocytes Relative 6.6 3.0 - 12.0 %   Eosinophils Relative 1.9 0.0 - 5.0 %   Basophils Relative 1.1 0.0 - 3.0 %   Neutro Abs 7.0 1.4 - 7.7 K/uL   Lymphs Abs 2.8 0.7 - 4.0 K/uL   Monocytes Absolute 0.7 0.1 - 1.0 K/uL   Eosinophils Absolute 0.2 0.0 - 0.7 K/uL   Basophils Absolute 0.1 0.0 - 0.1 K/uL  Iron, TIBC and Ferritin Panel     Status: Abnormal   Collection Time: 12/26/20  3:34 PM  Result Value Ref Range   Iron 46 40 - 190 mcg/dL   TIBC 778 242 - 353 mcg/dL (calc)   %SAT 14 (L) 16 - 45 % (calc)   Ferritin 91 16 - 154 ng/mL  Vitamin B12     Status: Abnormal   Collection Time: 12/26/20  3:34 PM  Result Value Ref Range   Vitamin B-12 >1550 (H) 211 - 911 pg/mL  Folate     Status: None   Collection Time: 12/26/20  3:34 PM  Result Value Ref Range   Folate >23.4 >5.9 ng/mL  Vitamin D (25 hydroxy)     Status: None   Collection Time: 12/26/20  3:34 PM  Result Value Ref Range   VITD 50.59 30.00 - 100.00 ng/mL   Objective  Body mass index is 21.55 kg/m. Wt Readings from Last 3 Encounters:  12/26/20 132 lb 12.8 oz (60.2 kg)  11/20/17 134 lb 4 oz (60.9 kg)  08/27/17 136 lb 8 oz (61.9 kg)   Temp Readings from Last 3 Encounters:  12/26/20 97.7 F (36.5 C) (Oral)  08/27/17 98.1 F (36.7 C) (Oral)  04/07/17 98.4 F (36.9 C) (Oral)  BP Readings from Last 3 Encounters:  12/26/20 124/78  11/20/17 104/62  08/27/17 124/62   Pulse Readings from Last 3 Encounters:  12/26/20 95   11/20/17 83  08/27/17 (!) 102    Physical Exam Vitals and nursing note reviewed.  Constitutional:      Appearance: Normal appearance. She is well-developed and well-groomed.  HENT:     Head: Normocephalic and atraumatic.  Eyes:     Conjunctiva/sclera: Conjunctivae normal.     Pupils: Pupils are equal, round, and reactive to light.  Cardiovascular:     Rate and Rhythm: Normal rate and regular rhythm.     Heart sounds: Normal heart sounds. No murmur heard. Pulmonary:     Effort: Pulmonary effort is normal.     Breath sounds: Normal breath sounds.  Neurological:     General: No focal deficit present.     Mental Status: She is alert and oriented to person, place, and time. Mental status is at baseline.     Sensory: Sensation is intact.     Motor: Motor function is intact.     Coordination: Coordination is intact.     Gait: Gait is intact. Gait normal.     Comments: Pinprick intact V1-3 left side, left arm and left leg and normal sensation b/l on exam today  Psychiatric:        Attention and Perception: Attention and perception normal.        Mood and Affect: Mood and affect normal.        Speech: Speech normal.        Behavior: Behavior normal. Behavior is cooperative.        Thought Content: Thought content normal.        Cognition and Memory: Cognition and memory normal.        Judgment: Judgment normal.     Comments: Phq 9 score 5 and gad 7 13 today     Assessment  Plan  Dizziness and Numbness and tingling of left arm and leg, [place] denies substance use I.e etoh vs other Numbness and tingling - Plan: Comprehensive metabolic panel, CBC with Differential/Platelet, Iron, TIBC and Ferritin Panel, Vitamin B12, vitamin d, folate Ambulatory referral to Neurology, will need emg/ncg  Will order MRI w/ and w/o brain to r/u central cause  Stress Anxiety Disc [facility]  Consider disc with PCP re meds for anxiety at next f/u   Sinus  tachycardia Not taking propranolol 20 mg prn per Dr. Mariah Milling  Could be anxiety related  Ekg 12/22/20 ST+ [facility] 11/20/17 ST +  Provider: Dr. French Ana McLean-Scocuzza-[facility]

## 2021-01-03 ENCOUNTER — Ambulatory Visit: Payer: 59 | Admitting: Neurology

## 2021-01-03 ENCOUNTER — Encounter: Payer: Self-pay | Admitting: Neurology

## 2021-01-03 VITALS — BP 115/71 | HR 109 | Ht 67.0 in

## 2021-01-03 DIAGNOSIS — R202 Paresthesia of skin: Secondary | ICD-10-CM | POA: Diagnosis not present

## 2021-01-03 NOTE — Progress Notes (Signed)
Chief Complaint  Patient presents with   New Patient (Initial Visit)    Rm 16, with husband, states she is doing well, here for Numbness in left side       ASSESSMENT AND PLAN  Shelby Turner is a 35 y.o. female  Transient left-sided paresthesia  In the setting of anxiety  MRI of the brain is pending to rule out right hemisphere pathology  Other possibility also including anxiety induced,    DIAGNOSTIC DATA (LABS, IMAGING, TESTING) - I reviewed patient records, labs, notes, testing and imaging myself where available.   MEDICAL HISTORY:  Shelby Turner, is a 35 year old female, seen in request by Dr. Quentin Ore for evaluation of transient left-sided numbness, her primary care physician is Dr. Lorin Picket, [facility], she is accompanied by her husband at today's visit January 03, 2021.  I reviewed and summarized the referring note. PMHX. Tachycardia hypothyrodism Anxiety,   Today December 22, 2020, while walking with sudden turning on the downslope, she felt buckle of her left leg, but was able to continue walking without any difficulty, then she suddenly noticed left leg, body, arm numbness, left facial feel cold, but her husband check on her, there was no facial asymmetry, limb muscle weakness noted.  Her iWatch showed tachycardia up to 170s, she was [place] param[place], personally reviewed EKG showed tachycardia of 120, sinus tachycardia  her symptomslast about 5 minutes, she is now back to baseline,  She had long history of anxiety, has intermittent tachycardia, in 2019 had extensive cardiac evaluation, including cardiac monitoring, no significant abnormality showed  Laboratory evaluations in 2022, normal vitamin D, folic acid, CMP, CBC showed slight elevation of WBC 10.8, ferritin of 91, elevated B12  PHYSICAL EXAM:   Vitals:   01/03/21 0832  BP: 115/71  Pulse: (!) 109  Height: 5\' 7"  (1.702 m)   Not recorded     Body mass index is 20.8  kg/m.  PHYSICAL EXAMNIATION:  Gen: NAD, conversant, well nourised, well groomed                     Cardiovascular: Regular rate rhythm, no peripheral edema, warm, nontender. Eyes: Conjunctivae clear without exudates or hemorrhage Neck: Supple, no carotid bruits. Pulmonary: Clear to auscultation bilaterally   NEUROLOGICAL EXAM:  MENTAL STATUS: Speech:    Speech is normal; fluent and spontaneous with normal comprehension.  Cognition:     Orientation to time, place and person     Normal recent and remote memory     Normal Attention span and concentration     Normal Language, naming, repeating,spontaneous speech     Fund of knowledge   CRANIAL NERVES: CN II: Visual fields are full to confrontation. Pupils are round equal and briskly reactive to light. CN III, IV, VI: extraocular movement are normal. No ptosis. CN V: Facial sensation is intact to light touch CN VII: Face is symmetric with normal eye closure  CN VIII: Hearing is normal to causal conversation. CN IX, X: Phonation is normal. CN XI: Head turning and shoulder shrug are intact  MOTOR: There is no pronator drift of out-stretched arms. Muscle bulk and tone are normal. Muscle strength is normal.  REFLEXES: Reflexes are 2+ and symmetric at the biceps, triceps, knees, and ankles. Plantar responses are flexor.  SENSORY: Intact to light touch, pinprick and vibratory sensation are intact in fingers and toes.  COORDINATION: There is no trunk or limb dysmetria noted.  GAIT/STANCE: Posture  is normal. Gait is steady with normal steps, base, arm swing, and turning. Heel and toe walking are normal. Tandem gait is normal.  Romberg is absent.  REVIEW OF SYSTEMS:  Full 14 system review of systems performed and notable only for as above All other review of systems were negative.   ALLERGIES: No Known Allergies  HOME MEDICATIONS: Current Outpatient Medications  Medication Sig Dispense Refill   Cetirizine HCl 10 MG CAPS       cholecalciferol (VITAMIN D3) 25 MCG (1000 UNIT) tablet      etonogestrel (NEXPLANON) 68 MG IMPL implant Inject into the skin.     ibuprofen (ADVIL,MOTRIN) 600 MG tablet Take 1 tablet (600 mg total) by mouth every 6 (six) hours. 30 tablet 0   L-Theanine 200 MG CAPS      levothyroxine (SYNTHROID, LEVOTHROID) 100 MCG tablet Take 100 mcg by mouth daily before breakfast.     Lysine 500 MG CAPS Take 1 capsule by mouth daily.     Multiple Vitamins-Minerals (WOMENS MULTI GUMMIES PO)      propranolol (INDERAL) 20 MG tablet TAKE 1 TABLET BY MOUTH 3 TIMES DAILY AS NEEDED (AS NEEDED FOR FAST HEART RATES). 270 tablet 1   ranitidine (ZANTAC) 75 MG tablet Take 75 mg by mouth as needed.      No current facility-administered medications for this visit.    PAST MEDICAL HISTORY: Past Medical History:  Diagnosis Date   Carpal tunnel syndrome, bilateral    Hx of varicella    Postpartum care following vaginal delivery (10/28) 12/30/2014   Thyroid disease     PAST SURGICAL HISTORY: Past Surgical History:  Procedure Laterality Date   IUD REMOVAL     implanted in uterus    FAMILY HISTORY: Family History  Problem Relation Age of Onset   Kidney Stones Maternal Aunt    Kidney Stones Maternal Grandfather    Cancer Maternal Grandfather        basal cell carcinoma   Heart Problems Maternal Grandfather    COPD Paternal Grandfather     SOCIAL HISTORY: Social History   Socioeconomic History   Marital status: Married    Spouse name: Not on file   Number of children: Not on file   Years of education: Not on file   Highest education level: Not on file  Occupational History   Not on file  Tobacco Use   Smoking status: Never   Smokeless tobacco: Never  Substance and Sexual Activity   Alcohol use: No   Drug use: No   Sexual activity: Yes  Other Topics Concern   Not on file  Social History Narrative   Not on file   Social Determinants of Health   Financial Resource Strain: Not on file   Food Insecurity: Not on file  Transportation Needs: Not on file  Physical Activity: Not on file  Stress: Not on file  Social Connections: Not on file  Intimate Partner Violence: Not on file      Levert Feinstein, M.D. Ph.D.  [facility] [address] [place], [place] [address] Ph: [phone] Fax: [phone]  CC:  McLean-Scocuzza, Pasty Spillers, MD [address] [place],  [place] [address]  Dale Rumson, MD

## 2021-01-11 ENCOUNTER — Other Ambulatory Visit: Payer: 59

## 2021-01-18 ENCOUNTER — Other Ambulatory Visit: Payer: Self-pay

## 2021-01-18 ENCOUNTER — Ambulatory Visit
Admission: RE | Admit: 2021-01-18 | Discharge: 2021-01-18 | Disposition: A | Discharge status: Home / Self Care | Payer: 59 | Source: Ambulatory Visit | Attending: Internal Medicine | Admitting: Internal Medicine

## 2021-01-18 DIAGNOSIS — R000 Tachycardia, unspecified: Secondary | ICD-10-CM | POA: Diagnosis present

## 2021-01-18 DIAGNOSIS — R42 Dizziness and giddiness: Secondary | ICD-10-CM | POA: Insufficient documentation

## 2021-01-18 DIAGNOSIS — R200 Anesthesia of skin: Secondary | ICD-10-CM | POA: Insufficient documentation

## 2021-01-18 DIAGNOSIS — R202 Paresthesia of skin: Secondary | ICD-10-CM | POA: Diagnosis present

## 2021-01-18 MED ORDER — GADOBUTROL 1 MMOL/ML IV SOLN
6.0000 mL | Freq: Once | INTRAVENOUS | Status: AC | PRN
  Administered 2021-01-18: 6 mL via INTRAVENOUS

## 2021-01-23 ENCOUNTER — Ambulatory Visit (INDEPENDENT_AMBULATORY_CARE_PROVIDER_SITE_OTHER): Payer: 59 | Admitting: Internal Medicine

## 2021-01-23 ENCOUNTER — Other Ambulatory Visit: Payer: Self-pay

## 2021-01-23 DIAGNOSIS — F439 Reaction to severe stress, unspecified: Secondary | ICD-10-CM

## 2021-01-23 DIAGNOSIS — E041 Nontoxic single thyroid nodule: Secondary | ICD-10-CM | POA: Diagnosis not present

## 2021-01-23 DIAGNOSIS — R000 Tachycardia, unspecified: Secondary | ICD-10-CM | POA: Diagnosis not present

## 2021-01-23 DIAGNOSIS — G5603 Carpal tunnel syndrome, bilateral upper limbs: Secondary | ICD-10-CM

## 2021-01-23 DIAGNOSIS — I479 Paroxysmal tachycardia, unspecified: Secondary | ICD-10-CM

## 2021-01-23 MED ORDER — SERTRALINE HCL 50 MG PO TABS: ORAL_TABLET | ORAL | 1 refills | Status: DC

## 2021-01-23 NOTE — Progress Notes (Signed)
Patient ID: Shelby Turner, female   DOB: Jun 15, 1985, 35 y.o.   MRN: [id number]   Subjective:    Patient ID: Shelby Turner, female    DOB: 05/12/1985, 35 y.o.   MRN: [id number]  This visit occurred during the SARS-CoV-2 public health emergency.  Safety protocols were in place, including screening questions prior to the visit, additional usage of staff PPE, and extensive cleaning of exam room while observing appropriate contact time as indicated for disinfecting solutions.   Patient here for a scheduled follow up.     HPI She was recently evaluated (12/26/20) after experiencing left leg, left arm and left facial numbness.  [place]  [place] stressful [place].  She turned and felt a little motion sickness.  Then turned to step - down a slight slope - looked back and felt a little light headed.  Her left foot then left side - felt tingling.  Left arm, left leg - tinging and left side of face felt cold.  States her watch revealed heart rate of 162 and documented "afib".  No facial drooping.  No confusion.  No syncope or near syncope.  Episode lasted approximately 10 minutes.  States had another episode, "not as bad".  This episode lasted approximately 15 minutes.  [place]  Had rhythm strip - revealed ST with ventricular rate 127.  Glucose ok.  Blood pressures 134/80s.  Symptoms resolved.  Evaluated the following day 12/26/20 - By DR McLean-Scocuzza.  MRI - ordered - revealed no acute abnormality.  Saw neurology.  No changes made.  Discussed anxiety and increased stress.  Increased family stress.  Feels needs something to help level things out.  Tries to stay active.  No chest pain with increased activity or exertion.  Breathing overall stable.  Eating.  No nausea or vomiting reported.     Past Medical History:  Diagnosis Date   Carpal tunnel syndrome, bilateral    Hx of varicella    Postpartum care following vaginal delivery (10/28) 12/30/2014    Thyroid disease    Past Surgical History:  Procedure Laterality Date   IUD REMOVAL     implanted in uterus   Family History  Problem Relation Age of Onset   Kidney Stones Maternal Aunt    Kidney Stones Maternal Grandfather    Cancer Maternal Grandfather        basal cell carcinoma   Heart Problems Maternal Grandfather    COPD Paternal Grandfather    Social History   Socioeconomic History   Marital status: Married    Spouse name: Not on file   Number of children: Not on file   Years of education: Not on file   Highest education level: Not on file  Occupational History   Not on file  Tobacco Use   Smoking status: Never   Smokeless tobacco: Never  Substance and Sexual Activity   Alcohol use: No   Drug use: No   Sexual activity: Yes  Other Topics Concern   Not on file  Social History Narrative   Not on file   Social Determinants of Health   Financial Resource Strain: Not on file  Food Insecurity: Not on file  Transportation Needs: Not on file  Physical Activity: Not on file  Stress: Not on file  Social Connections: Not on file     Review of Systems  Constitutional:  Negative for appetite change, fatigue and unexpected weight  change.  HENT:  Negative for congestion and sinus pressure.   Respiratory:  Negative for cough, chest tightness and shortness of breath.   Cardiovascular:  Negative for chest pain and leg swelling.       Increased heart rate as outlined.    Gastrointestinal:  Negative for abdominal pain, diarrhea, nausea and vomiting.  Genitourinary:  Negative for difficulty urinating and dysuria.  Musculoskeletal:  Negative for joint swelling and myalgias.  Skin:  Negative for color change and rash.  Neurological:  Negative for dizziness and headaches.       Lightheadedness as outlined.  Left leg and left arm - tingling as outlined.  Left side face - felt cold.    Psychiatric/Behavioral:         Increased stress and anxiety as outlined.         Objective:     BP 108/70   Pulse 80   Temp (!) 97 F (36.1 C)   Resp 16   Ht 5\' 7"  (1.702 m)   Wt 133 lb 6.4 oz (60.5 kg)   LMP 12/24/2020 Comment: nexplanon  SpO2 99%   BMI 20.89 kg/m  Wt Readings from Last 3 Encounters:  01/23/21 133 lb 6.4 oz (60.5 kg)  12/26/20 132 lb 12.8 oz (60.2 kg)  11/20/17 134 lb 4 oz (60.9 kg)    Physical Exam Vitals reviewed.  Constitutional:      General: She is not in acute distress.    Appearance: Normal appearance.  HENT:     Head: Normocephalic and atraumatic.     Right Ear: External ear normal.     Left Ear: External ear normal.  Eyes:     General: No scleral icterus.       Right eye: No discharge.        Left eye: No discharge.     Conjunctiva/sclera: Conjunctivae normal.  Neck:     Thyroid: No thyromegaly.  Cardiovascular:     Rate and Rhythm: Normal rate and regular rhythm.  Pulmonary:     Effort: No respiratory distress.     Breath sounds: Normal breath sounds. No wheezing.  Abdominal:     General: Bowel sounds are normal.     Palpations: Abdomen is soft.     Tenderness: There is no abdominal tenderness.  Musculoskeletal:        General: No swelling or tenderness.     Cervical back: Neck supple. No tenderness.  Lymphadenopathy:     Cervical: No cervical adenopathy.  Skin:    Findings: No erythema or rash.  Neurological:     Mental Status: She is alert.  Psychiatric:        Mood and Affect: Mood normal.        Behavior: Behavior normal.     Outpatient Encounter Medications as of 01/23/2021  Medication Sig   Cetirizine HCl 10 MG CAPS    cholecalciferol (VITAMIN D3) 25 MCG (1000 UNIT) tablet    etonogestrel (NEXPLANON) 68 MG IMPL implant Inject into the skin.   ibuprofen (ADVIL,MOTRIN) 600 MG tablet Take 1 tablet (600 mg total) by mouth every 6 (six) hours.   L-Theanine 200 MG CAPS    levothyroxine (SYNTHROID, LEVOTHROID) 100 MCG tablet Take 100 mcg by mouth daily before breakfast.   Lysine 500 MG CAPS Take 1  capsule by mouth daily.   Multiple Vitamins-Minerals (WOMENS MULTI GUMMIES PO)    propranolol (INDERAL) 20 MG tablet TAKE 1 TABLET BY MOUTH 3 TIMES DAILY AS NEEDED (AS  NEEDED FOR FAST HEART RATES).   ranitidine (ZANTAC) 75 MG tablet Take 75 mg by mouth as needed.    sertraline (ZOLOFT) 50 MG tablet Take 1/2 tablet q day x 1 week and then one po q day   No facility-administered encounter medications on file as of 01/23/2021.     Lab Results  Component Value Date   WBC 10.8 (H) 12/26/2020   HGB 13.2 12/26/2020   HCT 39.0 12/26/2020   PLT 271.0 12/26/2020   GLUCOSE 85 12/26/2020   ALT 13 12/26/2020   AST 12 12/26/2020   NA 137 12/26/2020   K 3.7 12/26/2020   CL 104 12/26/2020   CREATININE 0.82 12/26/2020   BUN 16 12/26/2020   CO2 26 12/26/2020   TSH 17.40 (H) 04/07/2017    MR Brain W Wo Contrast  Result Date: 01/18/2021 CLINICAL DATA:  Left-sided face, arm, and leg numbness and tingling. Dizziness. EXAM: MRI HEAD WITHOUT AND WITH CONTRAST TECHNIQUE: Multiplanar, multiecho pulse sequences of the brain and surrounding structures were obtained without and with intravenous contrast. CONTRAST:  9mL GADAVIST GADOBUTROL 1 MMOL/ML IV SOLN COMPARISON:  None. FINDINGS: Brain: There is no evidence of an acute infarct, intracranial hemorrhage, mass, midline shift, or extra-axial fluid collection. The ventricles and sulci are normal. The brain is normal in signal. No abnormal brain parenchymal or meningeal enhancement is identified. A tiny developmental venous anomaly is incidentally noted in the right corona radiata. Vascular: Major intracranial vascular flow voids are preserved. Skull and upper cervical spine: Unremarkable bone marrow signal. Sinuses/Orbits: Unremarkable orbits. Paranasal sinuses and mastoid air cells are clear. Other: None. IMPRESSION: Negative brain MRI. Electronically Signed   By: Sebastian Ache M.D.   On: 01/18/2021 16:51       Assessment & Plan:   Problem List Items  Addressed This Visit     CTS (carpal tunnel syndrome)    Has worn splints.  Stable.       Relevant Medications   sertraline (ZOLOFT) 50 MG tablet   Paroxysmal tachycardia (HCC)    Previously saw Dr Mariah Milling.  Zio monitor - sinus tachycardia with no significant arrhythmia.  With recent episode, described increased heart rate as outlined.  Her watch - revealed afib with ventricular rate of 170.  Heart tracing by EMTs when occurring revealed ST with ventricular rate 130.  Given recent episodes and documented elevated heart rate, will refer back to cardiology for further evaluation.  (question of need for f/u monitor, etc).        Relevant Orders   Ambulatory referral to Cardiology   Sinus tachycardia    Previously evaluated by Dr Mariah Milling.  zio monitor as outlined.  Diagnosed with ST.  Given recent left side tingling and associated increased heart rate, refer back to cardiology for further evaluation.        Stress    Increased stress as outlined.  Discussed.  Start zoloft as directed.  Discussed therapy.  Follow.  Get her back in soon to reassess.       Thyroid nodule    MNG.  Followed by Dr Val EagleElveria Rising.  Stable.         Dale Englewood, MD

## 2021-01-24 ENCOUNTER — Encounter: Payer: Self-pay | Admitting: Internal Medicine

## 2021-01-25 DIAGNOSIS — G5600 Carpal tunnel syndrome, unspecified upper limb: Secondary | ICD-10-CM | POA: Insufficient documentation

## 2021-01-25 NOTE — Assessment & Plan Note (Signed)
Increased stress as outlined.  Discussed.  Start zoloft as directed.  Discussed therapy.  Follow.  Get her back in soon to reassess.

## 2021-01-25 NOTE — Assessment & Plan Note (Signed)
Previously evaluated by Dr Mariah Milling.  zio monitor as outlined.  Diagnosed with ST.  Given recent left side tingling and associated increased heart rate, refer back to cardiology for further evaluation.

## 2021-01-25 NOTE — Assessment & Plan Note (Signed)
Previously saw Dr Mariah Milling.  Zio monitor - sinus tachycardia with no significant arrhythmia.  With recent episode, described increased heart rate as outlined.  Her watch - revealed afib with ventricular rate of 170.  Heart tracing by EMTs when occurring revealed ST with ventricular rate 130.  Given recent episodes and documented elevated heart rate, will refer back to cardiology for further evaluation.  (question of need for f/u monitor, etc).

## 2021-01-25 NOTE — Assessment & Plan Note (Signed)
MNG.  Followed by Dr Val EagleElveria Rising.  Stable.

## 2021-01-25 NOTE — Assessment & Plan Note (Signed)
Has worn splints.  Stable.

## 2021-02-18 ENCOUNTER — Other Ambulatory Visit: Payer: Self-pay | Admitting: Internal Medicine

## 2021-03-15 ENCOUNTER — Ambulatory Visit: Payer: 59 | Admitting: Internal Medicine

## 2021-03-15 ENCOUNTER — Other Ambulatory Visit: Payer: Self-pay

## 2021-03-15 DIAGNOSIS — F419 Anxiety disorder, unspecified: Secondary | ICD-10-CM | POA: Diagnosis not present

## 2021-03-15 DIAGNOSIS — E041 Nontoxic single thyroid nodule: Secondary | ICD-10-CM | POA: Diagnosis not present

## 2021-03-15 DIAGNOSIS — R42 Dizziness and giddiness: Secondary | ICD-10-CM

## 2021-03-15 DIAGNOSIS — F439 Reaction to severe stress, unspecified: Secondary | ICD-10-CM

## 2021-03-15 DIAGNOSIS — I479 Paroxysmal tachycardia, unspecified: Secondary | ICD-10-CM

## 2021-03-15 NOTE — Progress Notes (Signed)
Patient ID: Shelby Turner, female   DOB: 1985-07-26, 36 y.o.   MRN: [id number]   Subjective:    Patient ID: Shelby Turner, female    DOB: August 27, 1985, 36 y.o.   MRN: [id number]  This visit occurred during the SARS-CoV-2 public health emergency.  Safety protocols were in place, including screening questions prior to the visit, additional usage of staff PPE, and extensive cleaning of exam room while observing appropriate contact time as indicated for disinfecting solutions.   Patient here for a scheduled follow up.   Chief Complaint  Patient presents with   Follow-up   Anxiety   .   HPI Here to follow up regarding stress.  Previous episodes of increased heart rate.  Started on zoloft.  Overall doing better.  Feels better.  Has had a couple of episodes since last office visit.  Approximately one month ago - stood up fast - light headed.  Also had an episode when standing at counter - looked up fast - lasted a couple of seconds.  No other significant episodes.  No chest pain or sob reported.  No abdominal pain or bowel change reported.  Headache with menstrual period.     Past Medical History:  Diagnosis Date   Carpal tunnel syndrome, bilateral    Hx of varicella    Postpartum care following vaginal delivery (10/28) 12/30/2014   Thyroid disease    Past Surgical History:  Procedure Laterality Date   IUD REMOVAL     implanted in uterus   Family History  Problem Relation Age of Onset   Kidney Stones Maternal Aunt    Kidney Stones Maternal Grandfather    Cancer Maternal Grandfather        basal cell carcinoma   Heart Problems Maternal Grandfather    COPD Paternal Grandfather    Social History   Socioeconomic History   Marital status: Married    Spouse name: Not on file   Number of children: Not on file   Years of education: Not on file   Highest education level: Not on file  Occupational History   Not on file  Tobacco Use   Smoking status: Never   Smokeless tobacco: Never   Substance and Sexual Activity   Alcohol use: No   Drug use: No   Sexual activity: Yes  Other Topics Concern   Not on file  Social History Narrative   Not on file   Social Determinants of Health   Financial Resource Strain: Not on file  Food Insecurity: Not on file  Transportation Needs: Not on file  Physical Activity: Not on file  Stress: Not on file  Social Connections: Not on file     Review of Systems  Constitutional:  Negative for appetite change, fever and unexpected weight change.  HENT:  Negative for congestion and sinus pressure.   Respiratory:  Negative for cough, chest tightness and shortness of breath.   Cardiovascular:  Negative for chest pain, palpitations and leg swelling.  Gastrointestinal:  Negative for abdominal pain, diarrhea, nausea and vomiting.  Genitourinary:  Negative for difficulty urinating and dysuria.  Musculoskeletal:  Negative for joint swelling and myalgias.  Skin:  Negative for color change and rash.  Neurological:  Positive for light-headedness.       Headache - with menstrual period.   Psychiatric/Behavioral:  Negative for agitation and dysphoric mood.       Objective:     BP 108/68    Pulse 85    Temp  97.6 F (36.4 C)    Resp 16    Ht 5\' 7"  (1.702 m)    Wt 134 lb 9.6 oz (61.1 kg)    SpO2 98%    BMI 21.08 kg/m  Wt Readings from Last 3 Encounters:  03/15/21 134 lb 9.6 oz (61.1 kg)  01/23/21 133 lb 6.4 oz (60.5 kg)  12/26/20 132 lb 12.8 oz (60.2 kg)    Physical Exam Vitals reviewed.  Constitutional:      General: She is not in acute distress.    Appearance: Normal appearance.  HENT:     Head: Normocephalic and atraumatic.     Right Ear: External ear normal.     Left Ear: External ear normal.  Eyes:     General: No scleral icterus.       Right eye: No discharge.        Left eye: No discharge.     Conjunctiva/sclera: Conjunctivae normal.  Neck:     Thyroid: No thyromegaly.  Cardiovascular:     Rate and Rhythm: Normal rate  and regular rhythm.  Pulmonary:     Effort: No respiratory distress.     Breath sounds: Normal breath sounds. No wheezing.  Abdominal:     General: Bowel sounds are normal.     Palpations: Abdomen is soft.     Tenderness: There is no abdominal tenderness.  Musculoskeletal:        General: No swelling or tenderness.     Cervical back: Neck supple. No tenderness.  Lymphadenopathy:     Cervical: No cervical adenopathy.  Skin:    Findings: No erythema or rash.  Neurological:     Mental Status: She is alert.  Psychiatric:        Mood and Affect: Mood normal.        Behavior: Behavior normal.     Outpatient Encounter Medications as of 03/15/2021  Medication Sig   Cetirizine HCl 10 MG CAPS    cholecalciferol (VITAMIN D3) 25 MCG (1000 UNIT) tablet    etonogestrel (NEXPLANON) 68 MG IMPL implant Inject into the skin.   ibuprofen (ADVIL,MOTRIN) 600 MG tablet Take 1 tablet (600 mg total) by mouth every 6 (six) hours.   L-Theanine 200 MG CAPS    levothyroxine (SYNTHROID, LEVOTHROID) 100 MCG tablet Take 100 mcg by mouth daily before breakfast.   Lysine 500 MG CAPS Take 1 capsule by mouth daily.   Multiple Vitamins-Minerals (WOMENS MULTI GUMMIES PO)    propranolol (INDERAL) 20 MG tablet TAKE 1 TABLET BY MOUTH 3 TIMES DAILY AS NEEDED (AS NEEDED FOR FAST HEART RATES).   ranitidine (ZANTAC) 75 MG tablet Take 75 mg by mouth as needed.    sertraline (ZOLOFT) 50 MG tablet TAKE 1/2 TABLET ONCE A DAY X 1 WEEK AND THEN ONE TAB BY MOUTH DAILY   No facility-administered encounter medications on file as of 03/15/2021.     Lab Results  Component Value Date   WBC 10.8 (H) 12/26/2020   HGB 13.2 12/26/2020   HCT 39.0 12/26/2020   PLT 271.0 12/26/2020   GLUCOSE 85 12/26/2020   ALT 13 12/26/2020   AST 12 12/26/2020   NA 137 12/26/2020   K 3.7 12/26/2020   CL 104 12/26/2020   CREATININE 0.82 12/26/2020   BUN 16 12/26/2020   CO2 26 12/26/2020   TSH 17.40 (H) 04/07/2017    MR Brain W Wo  Contrast  Result Date: 01/18/2021 CLINICAL DATA:  Left-sided face, arm, and leg numbness and tingling.  Dizziness. EXAM: MRI HEAD WITHOUT AND WITH CONTRAST TECHNIQUE: Multiplanar, multiecho pulse sequences of the brain and surrounding structures were obtained without and with intravenous contrast. CONTRAST:  73mL GADAVIST GADOBUTROL 1 MMOL/ML IV SOLN COMPARISON:  None. FINDINGS: Brain: There is no evidence of an acute infarct, intracranial hemorrhage, mass, midline shift, or extra-axial fluid collection. The ventricles and sulci are normal. The brain is normal in signal. No abnormal brain parenchymal or meningeal enhancement is identified. A tiny developmental venous anomaly is incidentally noted in the right corona radiata. Vascular: Major intracranial vascular flow voids are preserved. Skull and upper cervical spine: Unremarkable bone marrow signal. Sinuses/Orbits: Unremarkable orbits. Paranasal sinuses and mastoid air cells are clear. Other: None. IMPRESSION: Negative brain MRI. Electronically Signed   By: Logan Bores M.D.   On: 01/18/2021 16:51       Assessment & Plan:   Problem List Items Addressed This Visit     Anxiety    Continue zoloft as outlined.  Follow.  Overall improved.       Light headed    Since last visit, symptoms overall improved.  Approximately one month ago, stood up quick.  Felt light headed.  Also standing at counter, looked up fast - lasted couple of seconds.  Overall improved.  Continue zoloft.  Desires to monitor.  Follow.        Paroxysmal tachycardia ([place])    Previously saw Dr Rockey Situ.  Zio monitor - sinus tachycardia with no significant arrhythmia.  With recent episode, described increased heart rate as outlined.  Her watch - revealed afib with ventricular rate of 170.  Heart tracing by EMTs when occurring revealed ST with ventricular rate 130.  Given recent episodes and documented elevated heart rate, was referred back to cardiology for further evaluation.  She is  doing better.  Desires to hold on referral at this time.  Follow.          Stress    Started on zoloft last visit.  Discussed.  Appears to be doing better.  Continue zoloft.  Follow.       Thyroid nodule    MNG.  Followed by Dr Jenetta DownerDina Rich.  Stable.         Einar Pheasant, MD

## 2021-03-23 ENCOUNTER — Encounter: Payer: Self-pay | Admitting: Internal Medicine

## 2021-03-23 DIAGNOSIS — R42 Dizziness and giddiness: Secondary | ICD-10-CM | POA: Insufficient documentation

## 2021-03-23 NOTE — Assessment & Plan Note (Signed)
Started on zoloft last visit.  Discussed.  Appears to be doing better.  Continue zoloft.  Follow.

## 2021-03-23 NOTE — Assessment & Plan Note (Signed)
Previously saw Dr Mariah Milling.  Zio monitor - sinus tachycardia with no significant arrhythmia.  With recent episode, described increased heart rate as outlined.  Her watch - revealed afib with ventricular rate of 170.  Heart tracing by EMTs when occurring revealed ST with ventricular rate 130.  Given recent episodes and documented elevated heart rate, was referred back to cardiology for further evaluation.  She is doing better.  Desires to hold on referral at this time.  Follow.

## 2021-03-23 NOTE — Assessment & Plan Note (Signed)
Continue zoloft as outlined.  Follow.  Overall improved.

## 2021-03-23 NOTE — Assessment & Plan Note (Signed)
MNG.  Followed by Dr O' Connell.  Stable.  

## 2021-03-23 NOTE — Assessment & Plan Note (Signed)
Since last visit, symptoms overall improved.  Approximately one month ago, stood up quick.  Felt light headed.  Also standing at counter, looked up fast - lasted couple of seconds.  Overall improved.  Continue zoloft.  Desires to monitor.  Follow.

## 2021-06-14 ENCOUNTER — Encounter: Payer: Self-pay | Admitting: Internal Medicine

## 2021-06-14 ENCOUNTER — Ambulatory Visit: Payer: 59 | Admitting: Internal Medicine

## 2021-06-14 VITALS — BP 118/72 | HR 80 | Temp 98.0°F | Resp 18 | Ht 67.0 in | Wt 138.0 lb

## 2021-06-14 DIAGNOSIS — E041 Nontoxic single thyroid nodule: Secondary | ICD-10-CM

## 2021-06-14 DIAGNOSIS — I479 Paroxysmal tachycardia, unspecified: Secondary | ICD-10-CM | POA: Diagnosis not present

## 2021-06-14 DIAGNOSIS — E538 Deficiency of other specified B group vitamins: Secondary | ICD-10-CM

## 2021-06-14 DIAGNOSIS — Z124 Encounter for screening for malignant neoplasm of cervix: Secondary | ICD-10-CM

## 2021-06-14 DIAGNOSIS — R42 Dizziness and giddiness: Secondary | ICD-10-CM

## 2021-06-14 DIAGNOSIS — F439 Reaction to severe stress, unspecified: Secondary | ICD-10-CM

## 2021-06-14 DIAGNOSIS — E611 Iron deficiency: Secondary | ICD-10-CM

## 2021-06-14 DIAGNOSIS — F419 Anxiety disorder, unspecified: Secondary | ICD-10-CM

## 2021-06-14 DIAGNOSIS — E559 Vitamin D deficiency, unspecified: Secondary | ICD-10-CM | POA: Diagnosis not present

## 2021-06-14 MED ORDER — SERTRALINE HCL 50 MG PO TABS: ORAL_TABLET | ORAL | 1 refills | Status: DC

## 2021-06-14 NOTE — Progress Notes (Signed)
Patient ID: Shelby Turner, female   DOB: 1985-12-05, 36 y.o.   MRN: [id number] ? ? ?Subjective:  ? ? Patient ID: Shelby Turner, female    DOB: September 25, 1985, 36 y.o.   MRN: [id number] ? ?This visit occurred during the SARS-CoV-2 public health emergency.  Safety protocols were in place, including screening questions prior to the visit, additional usage of staff PPE, and extensive cleaning of exam room while observing appropriate contact time as indicated for disinfecting solutions.  ? ?Patient here for a scheduled follow up.  ? .  ? ?HPI ?Follow up regarding paroxysmal tachycardia and increased stress.  She is doing better.  Feels better.  No further episodes of increased heart rate/palpitations. On zoloft.  Doing well on zoloft.  Discussed increasing dose.  Stays active.  No chest pain or sob reported.  No abdominal pain or bowel change reported.  No increased cough or congestion. On synthroid.  Saw endocrinology.  Recommended repeat ultrasound and if no change f/u ultrasound in 2 years.  Scheduled for ultrasound 07/2021.  ? ? ?Past Medical History:  ?Diagnosis Date  ? Carpal tunnel syndrome, bilateral   ? Hx of varicella   ? Postpartum care following vaginal delivery (10/28) 12/30/2014  ? Thyroid disease   ? ?Past Surgical History:  ?Procedure Laterality Date  ? IUD REMOVAL    ? implanted in uterus  ? ?Family History  ?Problem Relation Age of Onset  ? Kidney Stones Maternal Aunt   ? Kidney Stones Maternal Grandfather   ? Cancer Maternal Grandfather   ?     basal cell carcinoma  ? Heart Problems Maternal Grandfather   ? COPD Paternal Grandfather   ? ?Social History  ? ?Socioeconomic History  ? Marital status: Married  ?  Spouse name: Not on file  ? Number of children: Not on file  ? Years of education: Not on file  ? Highest education level: Not on file  ?Occupational History  ? Not on file  ?Tobacco Use  ? Smoking status: Never  ? Smokeless tobacco: Never  ?Substance and Sexual Activity  ? Alcohol use: No  ? Drug use: No   ? Sexual activity: Yes  ?Other Topics Concern  ? Not on file  ?Social History Narrative  ? Not on file  ? ?Social Determinants of Health  ? ?Financial Resource Strain: Not on file  ?Food Insecurity: Not on file  ?Transportation Needs: Not on file  ?Physical Activity: Not on file  ?Stress: Not on file  ?Social Connections: Not on file  ? ? ? ?Review of Systems  ?Constitutional:  Negative for appetite change and unexpected weight change.  ?HENT:  Negative for congestion and sinus pressure.   ?Respiratory:  Negative for cough, chest tightness and shortness of breath.   ?Cardiovascular:  Negative for chest pain, palpitations and leg swelling.  ?Gastrointestinal:  Negative for abdominal pain, diarrhea, nausea and vomiting.  ?Genitourinary:  Negative for difficulty urinating and dysuria.  ?Musculoskeletal:  Negative for joint swelling and myalgias.  ?Skin:  Negative for color change and rash.  ?Neurological:  Negative for dizziness, light-headedness and headaches.  ?Psychiatric/Behavioral:  Negative for agitation and dysphoric mood.   ? ?   ?Objective:  ?  ? ?BP 118/72 (BP Location: Left Arm, Patient Position: Sitting, Cuff Size: Small)   Pulse 80   Temp 98 ?F (36.7 ?C) (Temporal)   Resp 18   Ht 5\' 7"  (1.702 m)   Wt 138 lb (62.6 kg)   SpO2  100%   BMI 21.61 kg/m?  ?Wt Readings from Last 3 Encounters:  ?06/14/21 138 lb (62.6 kg)  ?03/15/21 134 lb 9.6 oz (61.1 kg)  ?01/23/21 133 lb 6.4 oz (60.5 kg)  ? ? ?Physical Exam ?Vitals reviewed.  ?Constitutional:   ?   General: She is not in acute distress. ?   Appearance: Normal appearance.  ?HENT:  ?   Head: Normocephalic and atraumatic.  ?   Right Ear: External ear normal.  ?   Left Ear: External ear normal.  ?Eyes:  ?   General: No scleral icterus.    ?   Right eye: No discharge.     ?   Left eye: No discharge.  ?   Conjunctiva/sclera: Conjunctivae normal.  ?Neck:  ?   Thyroid: No thyromegaly.  ?Cardiovascular:  ?   Rate and Rhythm: Normal rate and regular rhythm.   ?Pulmonary:  ?   Effort: No respiratory distress.  ?   Breath sounds: Normal breath sounds. No wheezing.  ?Abdominal:  ?   General: Bowel sounds are normal.  ?   Palpations: Abdomen is soft.  ?   Tenderness: There is no abdominal tenderness.  ?Musculoskeletal:     ?   General: No swelling or tenderness.  ?   Cervical back: Neck supple. No tenderness.  ?Lymphadenopathy:  ?   Cervical: No cervical adenopathy.  ?Skin: ?   Findings: No erythema or rash.  ?Neurological:  ?   Mental Status: She is alert.  ?Psychiatric:     ?   Mood and Affect: Mood normal.     ?   Behavior: Behavior normal.  ? ? ? ?Outpatient Encounter Medications as of 06/14/2021  ?Medication Sig  ? Cetirizine HCl 10 MG CAPS   ? cholecalciferol (VITAMIN D3) 25 MCG (1000 UNIT) tablet   ? etonogestrel (NEXPLANON) 68 MG IMPL implant Inject into the skin.  ? ibuprofen (ADVIL,MOTRIN) 600 MG tablet Take 1 tablet (600 mg total) by mouth every 6 (six) hours.  ? L-Theanine 200 MG CAPS   ? levothyroxine (SYNTHROID, LEVOTHROID) 100 MCG tablet Take 100 mcg by mouth daily before breakfast.  ? Lysine 500 MG CAPS Take 1 capsule by mouth daily.  ? Multiple Vitamins-Minerals (WOMENS MULTI GUMMIES PO)   ? propranolol (INDERAL) 20 MG tablet TAKE 1 TABLET BY MOUTH 3 TIMES DAILY AS NEEDED (AS NEEDED FOR FAST HEART RATES).  ? ranitidine (ZANTAC) 75 MG tablet Take 75 mg by mouth as needed.   ? sertraline (ZOLOFT) 50 MG tablet TAKE 1 1/2 tablet q day.  ? [DISCONTINUED] sertraline (ZOLOFT) 50 MG tablet TAKE 1/2 TABLET ONCE A DAY X 1 WEEK AND THEN ONE TAB BY MOUTH DAILY  ? ?No facility-administered encounter medications on file as of 06/14/2021.  ?  ? ?Lab Results  ?Component Value Date  ? WBC 10.8 (H) 12/26/2020  ? HGB 13.2 12/26/2020  ? HCT 39.0 12/26/2020  ? PLT 271.0 12/26/2020  ? GLUCOSE 85 12/26/2020  ? ALT 13 12/26/2020  ? AST 12 12/26/2020  ? NA 137 12/26/2020  ? K 3.7 12/26/2020  ? CL 104 12/26/2020  ? CREATININE 0.82 12/26/2020  ? BUN 16 12/26/2020  ? CO2 26 12/26/2020   ? TSH 17.40 (H) 04/07/2017  ? ? ?MR Brain W Wo Contrast ? ?Result Date: 01/18/2021 ?CLINICAL DATA:  Left-sided face, arm, and leg numbness and tingling. Dizziness. EXAM: MRI HEAD WITHOUT AND WITH CONTRAST TECHNIQUE: Multiplanar, multiecho pulse sequences of the brain and surrounding structures were  obtained without and with intravenous contrast. CONTRAST:  12mL GADAVIST GADOBUTROL 1 MMOL/ML IV SOLN COMPARISON:  None. FINDINGS: Brain: There is no evidence of an acute infarct, intracranial hemorrhage, mass, midline shift, or extra-axial fluid collection. The ventricles and sulci are normal. The brain is normal in signal. No abnormal brain parenchymal or meningeal enhancement is identified. A tiny developmental venous anomaly is incidentally noted in the right corona radiata. Vascular: Major intracranial vascular flow voids are preserved. Skull and upper cervical spine: Unremarkable bone marrow signal. Sinuses/Orbits: Unremarkable orbits. Paranasal sinuses and mastoid air cells are clear. Other: None. IMPRESSION: Negative brain MRI. Electronically Signed   By: Sebastian Ache M.D.   On: 01/18/2021 16:51  ? ? ?   ?Assessment & Plan:  ? ?Problem List Items Addressed This Visit   ? ? Anxiety  ?  Overall doing better.  On zoloft.  Increase to 75mg  q day.  Follow.  ? ?  ?  ? Relevant Medications  ? sertraline (ZOLOFT) 50 MG tablet  ? Cervical cancer screening  ?  Followed by gyn.  ? ?  ?  ? Paroxysmal tachycardia (HCC) - Primary  ?  Previously saw Dr .  Zio monitor - sinus tachycardia with no significant arrhythmia. On zoloft.  Feels better.  No increased heart rate or palpitations since last visit.  Follow.  ? ?  ?  ? Stress  ?  On zoloft.  Feels better.  Discussed.  Continue zoloft - increase dose to 75mg  q day.  Follow.  ? ?  ?  ? Thyroid nodule  ?  Saw Dr Mariah Milling.  Recommended f/u ultrasound.  Scheduled for 07/2021.  On synthroid.  ? ?  ?  ? ?Other Visit Diagnoses   ? ? Folate deficiency      ? Vitamin D deficiency       ? Iron deficiency      ? Dizziness      ? ?  ? ? ? ?Gershon Crane, MD  ?

## 2021-06-16 ENCOUNTER — Encounter: Payer: Self-pay | Admitting: Internal Medicine

## 2021-06-16 NOTE — Assessment & Plan Note (Signed)
Followed by gyn

## 2021-06-16 NOTE — Assessment & Plan Note (Signed)
Previously saw Dr Gollan.  Zio monitor - sinus tachycardia with no significant arrhythmia. On zoloft.  Feels better.  No increased heart rate or palpitations since last visit.  Follow.  

## 2021-06-16 NOTE — Assessment & Plan Note (Signed)
Saw Dr Gershon Crane.  Recommended f/u ultrasound.  Scheduled for 07/2021.  On synthroid.  ?

## 2021-06-16 NOTE — Assessment & Plan Note (Signed)
Overall doing better.  On zoloft.  Increase to 75mg  q day.  Follow.  ?

## 2021-06-16 NOTE — Assessment & Plan Note (Signed)
On zoloft.  Feels better.  Discussed.  Continue zoloft - increase dose to 75mg  q day.  Follow.  ?

## 2021-07-17 NOTE — Progress Notes (Signed)
[facility] [address] [place] [address] Phone: [phone] Subjective:   Shelby Turner, am serving as a scribe for Dr. Antoine Turner. This visit occurred during the SARS-CoV-2 public health emergency.  Safety protocols were in place, including screening questions prior to the visit, additional usage of staff PPE, and extensive cleaning of exam room while observing appropriate contact time as indicated for disinfecting solutions.   I'm seeing this patient by the request  of:  Shelby Sigourney, MD  CC: Low back pain  LKG:[id number]  Shelby Turner is a 36 y.o. female coming in with complaint of lumbar spine pain. Patient states was sent by massage therapist. Dental hygienist. Was advised about should and neck. Sometimes hands go numb. Wake up at night bc hands are numb. LBP mostly when sleeping.      Past Medical History:  Diagnosis Date   Carpal tunnel syndrome, bilateral    Hx of varicella    Postpartum care following vaginal delivery (10/28) 12/30/2014   Thyroid disease    Past Surgical History:  Procedure Laterality Date   IUD REMOVAL     implanted in uterus   Social History   Socioeconomic History   Marital status: Married    Spouse name: Not on file   Number of children: Not on file   Years of education: Not on file   Highest education level: Not on file  Occupational History   Not on file  Tobacco Use   Smoking status: Never   Smokeless tobacco: Never  Substance and Sexual Activity   Alcohol use: No   Drug use: No   Sexual activity: Yes  Other Topics Concern   Not on file  Social History Narrative   Not on file   Social Determinants of Health   Financial Resource Strain: Not on file  Food Insecurity: Not on file  Transportation Needs: Not on file  Physical Activity: Not on file  Stress: Not on file  Social Connections: Not on file   No Known Allergies Family History  Problem Relation Age of Onset   Kidney  Stones Maternal Aunt    Kidney Stones Maternal Grandfather    Cancer Maternal Grandfather        basal cell carcinoma   Heart Problems Maternal Grandfather    COPD Paternal Grandfather     Current Outpatient Medications (Endocrine & Metabolic):    etonogestrel (NEXPLANON) 68 MG IMPL implant, Inject into the skin.   levothyroxine (SYNTHROID, LEVOTHROID) 100 MCG tablet, Take 100 mcg by mouth daily before breakfast.  Current Outpatient Medications (Cardiovascular):    propranolol (INDERAL) 20 MG tablet, TAKE 1 TABLET BY MOUTH 3 TIMES DAILY AS NEEDED (AS NEEDED FOR FAST HEART RATES).  Current Outpatient Medications (Respiratory):    Cetirizine HCl 10 MG CAPS,   Current Outpatient Medications (Analgesics):    ibuprofen (ADVIL,MOTRIN) 600 MG tablet, Take 1 tablet (600 mg total) by mouth every 6 (six) hours.   Current Outpatient Medications (Other):    gabapentin (NEURONTIN) 100 MG capsule, Take 2 capsules (200 mg total) by mouth at bedtime.   cholecalciferol (VITAMIN D3) 25 MCG (1000 UNIT) tablet,    L-Theanine 200 MG CAPS,    Lysine 500 MG CAPS, Take 1 capsule by mouth daily.   Multiple Vitamins-Minerals (WOMENS MULTI GUMMIES PO),    ranitidine (ZANTAC) 75 MG tablet, Take 75 mg by mouth as needed.    sertraline (ZOLOFT) 50 MG tablet, TAKE 1 1/2 tablet q  day.   Reviewed prior external information including notes and imaging from  primary care provider As well as notes that were available from care everywhere and other healthcare systems.  Past medical history, social, surgical and family history all reviewed in electronic medical record.  No pertanent information unless stated regarding to the chief complaint.   Review of Systems:  No  visual changes, nausea, vomiting, diarrhea, constipation, dizziness, abdominal pain, skin rash, fevers, chills, night sweats, weight loss, swollen lymph nodes, body aches, joint swelling, chest pain, shortness of breath, mood changes. POSITIVE muscle  aches, headache  Objective  Blood pressure 118/68, pulse 97, height 5\' 7"  (1.702 m), weight 137 lb (62.1 kg), SpO2 98 %, unknown if currently breastfeeding.   General: No apparent distress alert and oriented x3 mood and affect normal, dressed appropriately.  HEENT: Pupils equal, extraocular movements intact  Respiratory: Patient's speak in full sentences and does not appear short of breath  Cardiovascular: No lower extremity edema, non tender, no erythema  Gait normal with good balance and coordination.  MSK: Patient neck exam does have some mild loss of lordosis.  Tightness with sidebending in the neck bilaterally.  Negative Spurling's.  5 out of 5 strength of the upper extremities.  Positive Tinel's though of the carpal tunnel bilaterally.  Low back exam does have some loss of lordosis.  Some tenderness to palpation in the paraspinal musculature.  Mild tightness with FABER test.  Tightness of the hip flexors left greater than right.  Osteopathic findings C2 flexed rotated and side bent right C4 flexed rotated and side bent left C7 flexed rotated and side bent left T3 extended rotated and side bent right inhaled third rib T6 extended rotated and side bent left L2 flexed rotated and side bent right Sacrum right on right    Impression and Recommendations:    The above documentation has been reviewed and is accurate and complete , DO

## 2021-07-19 ENCOUNTER — Ambulatory Visit (INDEPENDENT_AMBULATORY_CARE_PROVIDER_SITE_OTHER): Payer: 59

## 2021-07-19 ENCOUNTER — Ambulatory Visit (INDEPENDENT_AMBULATORY_CARE_PROVIDER_SITE_OTHER): Payer: 59 | Admitting: Family Medicine

## 2021-07-19 VITALS — BP 118/68 | HR 97 | Ht 67.0 in | Wt 137.0 lb

## 2021-07-19 DIAGNOSIS — G5603 Carpal tunnel syndrome, bilateral upper limbs: Secondary | ICD-10-CM | POA: Diagnosis not present

## 2021-07-19 DIAGNOSIS — M545 Low back pain, unspecified: Secondary | ICD-10-CM | POA: Diagnosis not present

## 2021-07-19 DIAGNOSIS — G4486 Cervicogenic headache: Secondary | ICD-10-CM | POA: Diagnosis not present

## 2021-07-19 MED ORDER — GABAPENTIN 100 MG PO CAPS: 200.0000 mg | ORAL_CAPSULE | Freq: Every day | ORAL | 0 refills | Status: DC

## 2021-07-19 NOTE — Assessment & Plan Note (Signed)
Patient will wear braces at night, started on gabapentin, discussed icing regimen and home exercises otherwise.  Follow-up with me again in 6 to 8 weeks.  Worsening symptoms can consider injections.

## 2021-07-19 NOTE — Patient Instructions (Addendum)
Do prescribed exercises at least 3x a week Good to see you! Gabapentin 200mg  Wear braces at night Did manipulation Xrays today See you again in 5-6 weeks

## 2021-07-19 NOTE — Assessment & Plan Note (Signed)
Patient does have cervicogenic headaches.  We discussed posture and ergonomics.  Responded extremely well to osteopathic manipulation.  Discussed which activities to do which ones to avoid, increase activity slowly.  Follow-up again in 6 weeks

## 2021-08-15 NOTE — Progress Notes (Signed)
[name] [address] [place] [address] Phone: [phone] Subjective:   Shelby Turner, am serving as a scribe for Dr. Antoine Primas.  I'm seeing this patient by the request  of:  Dale Lihue, MD  CC: neck and back pain   FGH:[id number]  Shelby Turner is a 36 y.o. female coming in with complaint of back and neck pain. OMT 07/19/2021 for neck. Also f/u for B carpal tunnel. Patient states that her wrist pain is unchanged since last visit. 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 Pain in back has improved.   Medications patient has been prescribed: Gabapentin  Taking:         Reviewed prior external information including notes and imaging from previsou exam, outside providers and external EMR if available.   As well as notes that were available from care everywhere and other healthcare systems.  Past medical history, social, surgical and family history all reviewed in electronic medical record.  No pertanent information unless stated regarding to the chief complaint.   Past Medical History:  Diagnosis Date   Carpal tunnel syndrome, bilateral    Hx of varicella    Postpartum care following vaginal delivery (10/28) 12/30/2014   Thyroid disease     No Known Allergies   Review of Systems:  No headache, visual changes, nausea, vomiting, diarrhea, constipation, dizziness, abdominal pain, skin rash, fevers, chills, night sweats, weight loss, swollen lymph nodes, body aches, joint swelling, chest pain, shortness of breath, mood changes. POSITIVE muscle aches  Objective  Blood pressure 106/62, pulse 98, height 5\' 7"  (1.702 m), weight 138 lb (62.6 kg), last menstrual period 07/19/2021, SpO2 99 %, unknown if currently breastfeeding.   General: No apparent distress alert and oriented x3 mood and affect normal, dressed appropriately.  HEENT: Pupils equal, extraocular movements intact  Respiratory: Patient's speak in  full sentences and does not appear short of breath  Cardiovascular: No lower extremity edema, non tender, no erythema  Gait normal MSK:  Back   Limited muscular skeletal ultrasound was performed and interpreted by 07/21/2021, M  Limited ultrasound still shows the patient does have some enlargement of the median nerve bilaterally right greater than left.  Patient does have area of 1.72.  Patient does have thickening of the retinaculum on the right side.  Osteopathic findings  C2 flexed rotated and side bent right C6 flexed rotated and side bent left T3 extended rotated and side bent right inhaled rib T9 extended rotated and side bent left L2 flexed rotated and side bent right Sacrum right on right       Assessment and Plan:  Cervicogenic headache Patient did make significant improvements overall and is doing the exercises.  I do believe that the gabapentin could be help with the headaches as well as the carpal tunnel.  We discussed with patient we will continue to monitor.  No significant weakness of any of the extremities.  Patient will follow-up with me again in 6 to 8 weeks otherwise.  Responding well to manipulation.  Bilateral carpal tunnel syndrome Does have carpal tunnel.  Wearing the braces.  Discussed with patient about the possibility of injection which patient declined.  Patient will increase the gabapentin to 300 mg at nighttime and see how patient responds.  Follow-up with me again in 6 to 8 weeks.  Can consider nerve conduction study if necessary.    Nonallopathic problems  Decision today to treat with OMT was based  on Physical Exam  After verbal consent patient was treated with HVLA, ME, FPR techniques in cervical, rib, thoracic, lumbar, and sacral  areas  Patient tolerated the procedure well with improvement in symptoms  Patient given exercises, stretches and lifestyle modifications  See medications in patient instructions if given  Patient will follow up  in 4-8 weeks     The above documentation has been reviewed and is accurate and complete Judi Saa, DO         Note: This dictation was prepared with Dragon dictation along with smaller phrase technology. Any transcriptional errors that result from this process are unintentional.

## 2021-08-16 ENCOUNTER — Encounter: Payer: Self-pay | Admitting: Family Medicine

## 2021-08-16 ENCOUNTER — Ambulatory Visit: Payer: 59 | Admitting: Family Medicine

## 2021-08-16 ENCOUNTER — Ambulatory Visit: Payer: Self-pay

## 2021-08-16 VITALS — BP 106/62 | HR 98 | Ht 67.0 in | Wt 138.0 lb

## 2021-08-16 DIAGNOSIS — M9908 Segmental and somatic dysfunction of rib cage: Secondary | ICD-10-CM

## 2021-08-16 DIAGNOSIS — G4486 Cervicogenic headache: Secondary | ICD-10-CM

## 2021-08-16 DIAGNOSIS — M9902 Segmental and somatic dysfunction of thoracic region: Secondary | ICD-10-CM

## 2021-08-16 DIAGNOSIS — M25532 Pain in left wrist: Secondary | ICD-10-CM

## 2021-08-16 DIAGNOSIS — M9903 Segmental and somatic dysfunction of lumbar region: Secondary | ICD-10-CM | POA: Diagnosis not present

## 2021-08-16 DIAGNOSIS — M9904 Segmental and somatic dysfunction of sacral region: Secondary | ICD-10-CM

## 2021-08-16 DIAGNOSIS — M9901 Segmental and somatic dysfunction of cervical region: Secondary | ICD-10-CM | POA: Diagnosis not present

## 2021-08-16 DIAGNOSIS — M25531 Pain in right wrist: Secondary | ICD-10-CM | POA: Diagnosis not present

## 2021-08-16 DIAGNOSIS — G5603 Carpal tunnel syndrome, bilateral upper limbs: Secondary | ICD-10-CM

## 2021-08-16 MED ORDER — GABAPENTIN 300 MG PO CAPS: 300.0000 mg | ORAL_CAPSULE | Freq: Every day | ORAL | 0 refills | Status: DC

## 2021-08-16 NOTE — Assessment & Plan Note (Signed)
Patient did make significant improvements overall and is doing the exercises.  I do believe that the gabapentin could be help with the headaches as well as the carpal tunnel.  We discussed with patient we will continue to monitor.  No significant weakness of any of the extremities.  Patient will follow-up with me again in 6 to 8 weeks otherwise.  Responding well to manipulation.

## 2021-08-16 NOTE — Assessment & Plan Note (Signed)
Does have carpal tunnel.  Wearing the braces.  Discussed with patient about the possibility of injection which patient declined.  Patient will increase the gabapentin to 300 mg at nighttime and see how patient responds.  Follow-up with me again in 6 to 8 weeks.  Can consider nerve conduction study if necessary.

## 2021-08-16 NOTE — Patient Instructions (Signed)
Gabapentin 300mg   Write in one week and can change prescription if needed See mein 7-8 weeks

## 2021-08-21 ENCOUNTER — Encounter: Payer: Self-pay | Admitting: Internal Medicine

## 2021-09-13 ENCOUNTER — Ambulatory Visit: Payer: 59 | Admitting: Internal Medicine

## 2021-10-04 ENCOUNTER — Encounter: Payer: Self-pay | Admitting: Internal Medicine

## 2021-10-04 ENCOUNTER — Ambulatory Visit (INDEPENDENT_AMBULATORY_CARE_PROVIDER_SITE_OTHER): Payer: 59 | Admitting: Internal Medicine

## 2021-10-04 DIAGNOSIS — F439 Reaction to severe stress, unspecified: Secondary | ICD-10-CM

## 2021-10-04 DIAGNOSIS — G5603 Carpal tunnel syndrome, bilateral upper limbs: Secondary | ICD-10-CM

## 2021-10-04 DIAGNOSIS — F419 Anxiety disorder, unspecified: Secondary | ICD-10-CM

## 2021-10-04 DIAGNOSIS — I479 Paroxysmal tachycardia, unspecified: Secondary | ICD-10-CM

## 2021-10-04 DIAGNOSIS — G4486 Cervicogenic headache: Secondary | ICD-10-CM

## 2021-10-04 DIAGNOSIS — Z124 Encounter for screening for malignant neoplasm of cervix: Secondary | ICD-10-CM

## 2021-10-04 DIAGNOSIS — E041 Nontoxic single thyroid nodule: Secondary | ICD-10-CM

## 2021-10-04 MED ORDER — SERTRALINE HCL 50 MG PO TABS: ORAL_TABLET | ORAL | 1 refills | Status: DC

## 2021-10-04 NOTE — Progress Notes (Signed)
Patient ID: Shelby Turner, female   DOB: [birth year], 36 y.o.   MRN: [id number]   Subjective:    Patient ID: Shelby Turner, female    DOB: 12-17-1985, 36 y.o.   MRN: [id number]   Patient here for a scheduled follow up .   HPI F/u regarding increased stress and paroxysmal tachycardia.  She is doing better.  Feels better.  Last visit, zoloft increased to 75mg  q day.  Seems to be doing well on this dose.  Appears to be handling things well.  No significant issues with increased heart rate or palpitations.  No chest pain or sob reported.  Stays active.  No acid reflux or abdominal pain reported.  Seeing Dr Tamala Julian for f/u wrist.  S/p ultrasound.  Has f/u two weeks - possible injection.    Past Medical History:  Diagnosis Date   Carpal tunnel syndrome, bilateral    Hx of varicella    Postpartum care following vaginal delivery (10/28) 12/30/2014   Thyroid disease    Past Surgical History:  Procedure Laterality Date   IUD REMOVAL     implanted in uterus   Family History  Problem Relation Age of Onset   Kidney Stones Maternal Aunt    Kidney Stones Maternal Grandfather    Cancer Maternal Grandfather        basal cell carcinoma   Heart Problems Maternal Grandfather    COPD Paternal Grandfather    Social History   Socioeconomic History   Marital status: Married    Spouse name: Not on file   Number of children: Not on file   Years of education: Not on file   Highest education level: Not on file  Occupational History   Not on file  Tobacco Use   Smoking status: Never   Smokeless tobacco: Never  Substance and Sexual Activity   Alcohol use: No   Drug use: No   Sexual activity: Yes  Other Topics Concern   Not on file  Social History Narrative   Not on file   Social Determinants of Health   Financial Resource Strain: Not on file  Food Insecurity: Not on file  Transportation Needs: Not on file  Physical Activity: Not on file  Stress: Not on file  Social Connections: Not on  file     Review of Systems  Constitutional:  Negative for appetite change and unexpected weight change.  HENT:  Negative for congestion and sinus pressure.   Respiratory:  Negative for cough, chest tightness and shortness of breath.   Cardiovascular:  Negative for chest pain, palpitations and leg swelling.  Gastrointestinal:  Negative for abdominal pain, diarrhea, nausea and vomiting.  Genitourinary:  Negative for difficulty urinating and dysuria.  Musculoskeletal:  Negative for joint swelling and myalgias.  Skin:  Negative for color change and rash.  Neurological:  Negative for dizziness, light-headedness and headaches.  Psychiatric/Behavioral:  Negative for agitation and dysphoric mood.        Objective:     BP 120/66 (BP Location: Left Arm, Patient Position: Sitting, Cuff Size: Small)   Pulse 70   Temp 98 F (36.7 C) (Temporal)   Resp 19   Ht 5\' 7"  (1.702 m)   Wt 140 lb 9.6 oz (63.8 kg)   SpO2 99%   BMI 22.02 kg/m  Wt Readings from Last 3 Encounters:  10/04/21 140 lb 9.6 oz (63.8 kg)  08/16/21 138 lb (62.6 kg)  07/19/21 137 lb (62.1 kg)    Physical Exam Vitals  reviewed.  Constitutional:      General: She is not in acute distress.    Appearance: Normal appearance.  HENT:     Head: Normocephalic and atraumatic.     Right Ear: External ear normal.     Left Ear: External ear normal.  Eyes:     General: No scleral icterus.       Right eye: No discharge.        Left eye: No discharge.     Conjunctiva/sclera: Conjunctivae normal.  Neck:     Thyroid: No thyromegaly.  Cardiovascular:     Rate and Rhythm: Normal rate and regular rhythm.  Pulmonary:     Effort: No respiratory distress.     Breath sounds: Normal breath sounds. No wheezing.  Abdominal:     General: Bowel sounds are normal.     Palpations: Abdomen is soft.     Tenderness: There is no abdominal tenderness.  Musculoskeletal:        General: No swelling or tenderness.     Cervical back: Neck supple.  No tenderness.  Lymphadenopathy:     Cervical: No cervical adenopathy.  Skin:    Findings: No erythema or rash.  Neurological:     Mental Status: She is alert.  Psychiatric:        Mood and Affect: Mood normal.        Behavior: Behavior normal.      Outpatient Encounter Medications as of 10/04/2021  Medication Sig   Cetirizine HCl 10 MG CAPS    cholecalciferol (VITAMIN D3) 25 MCG (1000 UNIT) tablet    etonogestrel (NEXPLANON) 68 MG IMPL implant Inject into the skin.   gabapentin (NEURONTIN) 300 MG capsule Take 1 capsule (300 mg total) by mouth at bedtime.   L-Theanine 200 MG CAPS    levothyroxine (SYNTHROID, LEVOTHROID) 100 MCG tablet Take 100 mcg by mouth daily before breakfast.   Lysine 500 MG CAPS Take 1 capsule by mouth daily.   Multiple Vitamins-Minerals (WOMENS MULTI GUMMIES PO)    propranolol (INDERAL) 20 MG tablet TAKE 1 TABLET BY MOUTH 3 TIMES DAILY AS NEEDED (AS NEEDED FOR FAST HEART RATES).   ranitidine (ZANTAC) 75 MG tablet Take 75 mg by mouth as needed.    [DISCONTINUED] sertraline (ZOLOFT) 50 MG tablet Take 50 mg by mouth daily. Take one and one half tablet by mouth daily   sertraline (ZOLOFT) 50 MG tablet TAKE 1 1/2 tablet q day.   [DISCONTINUED] ibuprofen (ADVIL,MOTRIN) 600 MG tablet Take 1 tablet (600 mg total) by mouth every 6 (six) hours.   [DISCONTINUED] sertraline (ZOLOFT) 50 MG tablet TAKE 1 1/2 tablet q day.   No facility-administered encounter medications on file as of 10/04/2021.     Lab Results  Component Value Date   WBC 10.8 (H) 12/26/2020   HGB 13.2 12/26/2020   HCT 39.0 12/26/2020   PLT 271.0 12/26/2020   GLUCOSE 85 12/26/2020   ALT 13 12/26/2020   AST 12 12/26/2020   NA 137 12/26/2020   K 3.7 12/26/2020   CL 104 12/26/2020   CREATININE 0.82 12/26/2020   BUN 16 12/26/2020   CO2 26 12/26/2020   TSH 17.40 (H) 04/07/2017    MR Brain W Wo Contrast  Result Date: 01/18/2021 CLINICAL DATA:  Left-sided face, arm, and leg numbness and tingling.  Dizziness. EXAM: MRI HEAD WITHOUT AND WITH CONTRAST TECHNIQUE: Multiplanar, multiecho pulse sequences of the brain and surrounding structures were obtained without and with intravenous contrast. CONTRAST:  78mL  GADAVIST GADOBUTROL 1 MMOL/ML IV SOLN COMPARISON:  None. FINDINGS: Brain: There is no evidence of an acute infarct, intracranial hemorrhage, mass, midline shift, or extra-axial fluid collection. The ventricles and sulci are normal. The brain is normal in signal. No abnormal brain parenchymal or meningeal enhancement is identified. A tiny developmental venous anomaly is incidentally noted in the right corona radiata. Vascular: Major intracranial vascular flow voids are preserved. Skull and upper cervical spine: Unremarkable bone marrow signal. Sinuses/Orbits: Unremarkable orbits. Paranasal sinuses and mastoid air cells are clear. Other: None. IMPRESSION: Negative brain MRI. Electronically Signed   By: Sebastian Ache M.D.   On: 01/18/2021 16:51       Assessment & Plan:   Problem List Items Addressed This Visit     Anxiety    On zoloft 75mg  q day now.  Doing better.  Continue current dose.  Follow.       Relevant Medications   sertraline (ZOLOFT) 50 MG tablet   Cervical cancer screening    Followed by gyn.       Cervicogenic headache    Headaches better.  Follow.       Relevant Medications   sertraline (ZOLOFT) 50 MG tablet   CTS (carpal tunnel syndrome)    On gabapentin.  Has f/u in a couple of weeks with Dr .  Follow.       Relevant Medications   sertraline (ZOLOFT) 50 MG tablet   Paroxysmal tachycardia (HCC)    Previously saw Dr Katrinka Blazing.  Zio monitor - sinus tachycardia with no significant arrhythmia. On zoloft.  Feels better.  No increased heart rate or palpitations since last visit.  Follow.       Stress    Discussed.  Appears to be doing better.  Continue zoloft at current dose. Follow.       Thyroid nodule    Saw Dr Mariah Milling.  Recommended f/u ultrasound.  F/u  ultrasound - 07/2021.  Stable. On synthroid.  Follow.         08/2021, MD

## 2021-10-06 ENCOUNTER — Encounter: Payer: Self-pay | Admitting: Internal Medicine

## 2021-10-06 NOTE — Assessment & Plan Note (Signed)
Followed by gyn

## 2021-10-06 NOTE — Assessment & Plan Note (Signed)
Saw Dr O'Connell.  Recommended f/u ultrasound.  F/u ultrasound - 07/2021.  Stable. On synthroid.  Follow.  

## 2021-10-06 NOTE — Assessment & Plan Note (Signed)
On gabapentin.  Has f/u in a couple of weeks with Dr Katrinka Blazing.  Follow.

## 2021-10-06 NOTE — Assessment & Plan Note (Signed)
Headaches better.  Follow.   

## 2021-10-06 NOTE — Assessment & Plan Note (Signed)
Discussed.  Appears to be doing better.  Continue zoloft at current dose. Follow.  

## 2021-10-06 NOTE — Assessment & Plan Note (Signed)
Previously saw Dr Gollan.  Zio monitor - sinus tachycardia with no significant arrhythmia. On zoloft.  Feels better.  No increased heart rate or palpitations since last visit.  Follow.  

## 2021-10-06 NOTE — Assessment & Plan Note (Signed)
On zoloft 75mg  q day now.  Doing better.  Continue current dose.  Follow.

## 2021-10-10 ENCOUNTER — Ambulatory Visit: Payer: 59 | Admitting: Family Medicine

## 2021-10-10 NOTE — Progress Notes (Signed)
[name] [address] [place] [address] Phone: [phone] Subjective:   Shelby Turner, am serving as a scribe for Dr. Antoine Primas.  I'm seeing this patient by the request  of:  Dale Polk, MD  CC: Bilateral wrist pain right greater than left and headache and neck pain  [id number]  08/16/2021 Does have carpal tunnel.  Wearing the braces.  Discussed with patient about the possibility of injection which patient declined.  Patient will increase the gabapentin to 300 mg at nighttime and see how patient responds.  Follow-up with me again in 6 to 8 weeks.  Can consider nerve conduction study if necessary.  Update 10/11/2021 Shelby Turner is a 36 y.o. female coming in with complaint of back and neck pain. Also f/u for B wrist pain. Patient states that she is the same as last visit.  Patient has had more tenderness of the wrist.  States that certain movements seem to give her more difficulty.  Seems to be right greater than left.  Patient does states headaches have been relatively stable.  Medications patient has been prescribed: Gabapentin  Taking:         Reviewed prior external information including notes and imaging from previsou exam, outside providers and external EMR if available.   As well as notes that were available from care everywhere and other healthcare systems.  Past medical history, social, surgical and family history all reviewed in electronic medical record.  No pertanent information unless stated regarding to the chief complaint.   Past Medical History:  Diagnosis Date   Carpal tunnel syndrome, bilateral    Hx of varicella    Postpartum care following vaginal delivery (10/28) 12/30/2014   Thyroid disease     No Known Allergies   Review of Systems:  No  visual changes, nausea, vomiting, diarrhea, constipation, dizziness, abdominal pain, skin rash, fevers, chills, night sweats, weight loss, swollen lymph  nodes, body aches, joint swelling, chest pain, shortness of breath, mood changes. POSITIVE muscle aches, headache  Objective  Blood pressure 120/62, pulse 100, height 5\' 7"  (1.702 m), weight 140 lb (63.5 kg), SpO2 97 %, unknown if currently breastfeeding.   General: No apparent distress alert and oriented x3 mood and affect normal, dressed appropriately.  HEENT: Pupils equal, extraocular movements intact  Respiratory: Patient's speak in full sentences and does not appear short of breath  Cardiovascular: No lower extremity edema, non tender, no erythema  Gait MSK:  Back does have some loss of lordosis.  Patient does have tightness in the neck noted with sidebending bilaterally.  Positive Tinel's on the right side.  Good grip strength noted.  Osteopathic findings  C2 flexed rotated and side bent right C5 flexed rotated and side bent left T3 extended rotated and side bent right inhaled rib T8 extended rotated and side bent left L3 flexed rotated and side bent right Sacrum right on right   Procedure: Real-time Ultrasound Guided Injection of right carpal tunnel Device: GE Logiq Q7  Ultrasound guided injection is preferred based studies that show increased duration, increased effect, greater accuracy, decreased procedural pain, increased response rate with ultrasound guided versus blind injection.  Verbal informed consent obtained.  Time-out conducted.  Noted no overlying erythema, induration, or other signs of local infection.  Skin prepped in a sterile fashion.  Local anesthesia: Topical Ethyl chloride.  With sterile technique and under real time ultrasound guidance:  median nerve visualized.  23g 5/8 inch needle inserted distal  to proximal approach into nerve sheath. Pictures taken nfor needle placement. Patient did have injection of 0.5 cc of 0.5% Marcaine, and 0.5 cc of Kenalog 40 mg/dL. Completed without difficulty  Pain immediately improved suggesting accurate placement of the  medication.  Advised to call if fevers/chills, erythema, induration, drainage, or persistent bleeding.  Impression: Technically successful ultrasound guided injection.    Assessment and Plan:  Bilateral carpal tunnel syndrome Patient given injection secondary to the chronic problem that is worsening.  I do think patient is going to make some progress overall.  Patient has responded very well to the injection.  We discussed icing regimen and home exercises otherwise.  Follow-up with me again in 6 to 8 weeks.  Cervicogenic headache Chronic, some tightness noted.  Gabapentin still noted.  If continuing to have more headaches we can consider the possibility of changing Zoloft to Effexor.  At the moment we will continue that with the continued medications.  Follow-up with me again in 6 to 8 weeks    Nonallopathic problems  Decision today to treat with OMT was based on Physical Exam  After verbal consent patient was treated with HVLA, ME, FPR techniques in cervical, rib, thoracic, lumbar, and sacral  areas  Patient tolerated the procedure well with improvement in symptoms  Patient given exercises, stretches and lifestyle modifications  See medications in patient instructions if given  Patient will follow up in 4-8 weeks    The above documentation has been reviewed and is accurate and complete Judi Saa, DO          Note: This dictation was prepared with Dragon dictation along with smaller phrase technology. Any transcriptional errors that result from this process are unintentional.

## 2021-10-11 ENCOUNTER — Encounter: Payer: Self-pay | Admitting: Family Medicine

## 2021-10-11 ENCOUNTER — Ambulatory Visit: Payer: Self-pay

## 2021-10-11 ENCOUNTER — Ambulatory Visit (INDEPENDENT_AMBULATORY_CARE_PROVIDER_SITE_OTHER): Payer: 59 | Admitting: Family Medicine

## 2021-10-11 VITALS — BP 120/62 | HR 100 | Ht 67.0 in | Wt 140.0 lb

## 2021-10-11 DIAGNOSIS — G5603 Carpal tunnel syndrome, bilateral upper limbs: Secondary | ICD-10-CM | POA: Diagnosis not present

## 2021-10-11 DIAGNOSIS — M9901 Segmental and somatic dysfunction of cervical region: Secondary | ICD-10-CM

## 2021-10-11 DIAGNOSIS — M9902 Segmental and somatic dysfunction of thoracic region: Secondary | ICD-10-CM | POA: Diagnosis not present

## 2021-10-11 DIAGNOSIS — M25531 Pain in right wrist: Secondary | ICD-10-CM

## 2021-10-11 DIAGNOSIS — M9908 Segmental and somatic dysfunction of rib cage: Secondary | ICD-10-CM | POA: Diagnosis not present

## 2021-10-11 DIAGNOSIS — G4486 Cervicogenic headache: Secondary | ICD-10-CM | POA: Diagnosis not present

## 2021-10-11 DIAGNOSIS — M25532 Pain in left wrist: Secondary | ICD-10-CM

## 2021-10-11 NOTE — Assessment & Plan Note (Signed)
Chronic, some tightness noted.  Gabapentin still noted.  If continuing to have more headaches we can consider the possibility of changing Zoloft to Effexor.  At the moment we will continue that with the continued medications.  Follow-up with me again in 6 to 8 weeks

## 2021-10-11 NOTE — Patient Instructions (Signed)
Injected wrist today See me again in 7-8 weeks

## 2021-10-11 NOTE — Assessment & Plan Note (Signed)
Patient given injection secondary to the chronic problem that is worsening.  I do think patient is going to make some progress overall.  Patient has responded very well to the injection.  We discussed icing regimen and home exercises otherwise.  Follow-up with me again in 6 to 8 weeks.

## 2021-11-13 ENCOUNTER — Other Ambulatory Visit: Payer: Self-pay | Admitting: Internal Medicine

## 2021-11-18 ENCOUNTER — Other Ambulatory Visit: Payer: Self-pay | Admitting: Family Medicine

## 2021-11-21 NOTE — Progress Notes (Signed)
[facility] [address] [place] Phone: [phone] Subjective:   IVilma Meckel, am serving as a scribe for Dr. Hulan Saas.  I'm seeing this patient by the request  of:  Einar Pheasant, MD  CC: back and neck pain   [id number]  MIKALA PODOLL is a 36 y.o. female coming in with complaint of back and neck pain. OMT on 10/11/2021. Also seen for carpal tunnel. Patient states doing well. Injection in right wrist last visit is helping a lot. Left is doing okay. Here for manipulation. No new issues.  Medications patient has been prescribed: gabapentin  Taking:Right          Reviewed prior external information including notes and imaging from previsou exam, outside providers and external EMR if available.   As well as notes that were available from care everywhere and other healthcare systems.  Past medical history, social, surgical and family history all reviewed in electronic medical record.  No pertanent information unless stated regarding to the chief complaint.   Past Medical History:  Diagnosis Date   Carpal tunnel syndrome, bilateral    Hx of varicella    Postpartum care following vaginal delivery (10/28) 12/30/2014   Thyroid disease     No Known Allergies   Review of Systems:  No headache, visual changes, nausea, vomiting, diarrhea, constipation, dizziness, abdominal pain, skin rash, fevers, chills, night sweats, weight loss, swollen lymph nodes, body aches, joint swelling, chest pain, shortness of breath, mood changes. POSITIVE muscle aches  Objective  Blood pressure 114/70, pulse 96, height 5\' 7"  (1.702 m), weight 140 lb (63.5 kg), SpO2 98 %, unknown if currently breastfeeding.   General: No apparent distress alert and oriented x3 mood and affect normal, dressed appropriately.  HEENT: Pupils equal, extraocular movements intact  Respiratory: Patient's speak in full sentences and does not appear short of breath   Cardiovascular: No lower extremity edema, non tender, no erythema  Gait MSK:  Back more tightness noted in the cervical spine.  Still seems to be more at the occipital area on the right side.  Patient does have some tightness in the parascapular region.  Mild tightness in the paraspinal musculature of the lumbar spine but very minimal.  Right wrist exam significant improvement with negative Tinel's.  Good grip strength  Osteopathic findings  C2 flexed rotated and side bent right C7 flexed rotated and side bent left T3 extended rotated and side bent right inhaled rib T8 extended rotated and side bent left L2 flexed rotated and side bent right Sacrum right on right       Assessment and Plan:  Cervicogenic headache Patient is actually more elevated now at this time.  Does have the gabapentin if necessary.  Responding extremely well to osteopathic manipulation.  Discussed icing regimen and home exercises.  Increase activity slowly otherwise.  Follow-up again in 6 to 8 weeks.    Nonallopathic problems  Decision today to treat with OMT was based on Physical Exam  After verbal consent patient was treated with HVLA, ME, FPR techniques in cervical, rib, thoracic, lumbar, and sacral  areas  Patient tolerated the procedure well with improvement in symptoms  Patient given exercises, stretches and lifestyle modifications  See medications in patient instructions if given  Patient will follow up in 4-8 weeks    The above documentation has been reviewed and is accurate and complete Lyndal Pulley, DO          Note:  This dictation was prepared with Dragon dictation along with smaller phrase technology. Any transcriptional errors that result from this process are unintentional.

## 2021-11-22 ENCOUNTER — Ambulatory Visit: Payer: 59 | Admitting: Family Medicine

## 2021-11-22 ENCOUNTER — Encounter: Payer: Self-pay | Admitting: Family Medicine

## 2021-11-22 VITALS — BP 114/70 | HR 96 | Ht 67.0 in | Wt 140.0 lb

## 2021-11-22 DIAGNOSIS — M9902 Segmental and somatic dysfunction of thoracic region: Secondary | ICD-10-CM | POA: Diagnosis not present

## 2021-11-22 DIAGNOSIS — G4486 Cervicogenic headache: Secondary | ICD-10-CM

## 2021-11-22 DIAGNOSIS — M9903 Segmental and somatic dysfunction of lumbar region: Secondary | ICD-10-CM

## 2021-11-22 DIAGNOSIS — M9904 Segmental and somatic dysfunction of sacral region: Secondary | ICD-10-CM | POA: Diagnosis not present

## 2021-11-22 DIAGNOSIS — M9908 Segmental and somatic dysfunction of rib cage: Secondary | ICD-10-CM

## 2021-11-22 DIAGNOSIS — M9901 Segmental and somatic dysfunction of cervical region: Secondary | ICD-10-CM

## 2021-11-22 NOTE — Assessment & Plan Note (Signed)
Patient is actually more elevated now at this time.  Does have the gabapentin if necessary.  Responding extremely well to osteopathic manipulation.  Discussed icing regimen and home exercises.  Increase activity slowly otherwise.  Follow-up again in 6 to 8 weeks.

## 2021-11-22 NOTE — Patient Instructions (Signed)
Great to see you as always I am so happy the wrist is better  Stay active See me again in 2-3 months

## 2022-01-10 ENCOUNTER — Ambulatory Visit: Payer: 59 | Admitting: Internal Medicine

## 2022-01-17 ENCOUNTER — Ambulatory Visit: Payer: 59 | Admitting: Internal Medicine

## 2022-01-17 ENCOUNTER — Encounter: Payer: Self-pay | Admitting: Internal Medicine

## 2022-01-17 VITALS — BP 120/78 | HR 78 | Temp 98.0°F | Resp 16 | Ht 67.0 in | Wt 142.0 lb

## 2022-01-17 DIAGNOSIS — I479 Paroxysmal tachycardia, unspecified: Secondary | ICD-10-CM

## 2022-01-17 DIAGNOSIS — Z124 Encounter for screening for malignant neoplasm of cervix: Secondary | ICD-10-CM

## 2022-01-17 DIAGNOSIS — F439 Reaction to severe stress, unspecified: Secondary | ICD-10-CM

## 2022-01-17 DIAGNOSIS — F419 Anxiety disorder, unspecified: Secondary | ICD-10-CM | POA: Diagnosis not present

## 2022-01-17 DIAGNOSIS — G5603 Carpal tunnel syndrome, bilateral upper limbs: Secondary | ICD-10-CM | POA: Diagnosis not present

## 2022-01-17 DIAGNOSIS — E041 Nontoxic single thyroid nodule: Secondary | ICD-10-CM

## 2022-01-17 NOTE — Progress Notes (Signed)
Patient ID: Shelby Turner, female   DOB: 07-30-1985, 36 y.o.   MRN: [id number]

## 2022-01-17 NOTE — Progress Notes (Unsigned)
Patient ID: Shelby Turner, female   DOB: [birth year], 36 y.o.   MRN: [id number]   Subjective:    Patient ID: Shelby Turner, female    DOB: Sep 01, 1985, 36 y.o.   MRN: [id number]   Patient here for  Chief Complaint  Patient presents with   Follow-up   .   HPI Here to follow up regarding increased stress.  Overall doing well.  Feels good.  Staying active.  Headaches are better.  Mostly occur around menstrual period.  Discussed magnesium.  No chest pain or sob reported.  No increased heart rate or palpitations reported.  No abdominal pain.     Past Medical History:  Diagnosis Date   Carpal tunnel syndrome, bilateral    Hx of varicella    Postpartum care following vaginal delivery (10/28) 12/30/2014   Thyroid disease    Past Surgical History:  Procedure Laterality Date   IUD REMOVAL     implanted in uterus   Family History  Problem Relation Age of Onset   Kidney Stones Maternal Aunt    Kidney Stones Maternal Grandfather    Cancer Maternal Grandfather        basal cell carcinoma   Heart Problems Maternal Grandfather    COPD Paternal Grandfather    Social History   Socioeconomic History   Marital status: Married    Spouse name: Not on file   Number of children: Not on file   Years of education: Not on file   Highest education level: Not on file  Occupational History   Not on file  Tobacco Use   Smoking status: Never   Smokeless tobacco: Never  Substance and Sexual Activity   Alcohol use: No   Drug use: No   Sexual activity: Yes  Other Topics Concern   Not on file  Social History Narrative   Not on file   Social Determinants of Health   Financial Resource Strain: Not on file  Food Insecurity: Not on file  Transportation Needs: Not on file  Physical Activity: Not on file  Stress: Not on file  Social Connections: Not on file     Review of Systems  Constitutional:  Negative for appetite change and unexpected weight change.  HENT:  Negative for congestion  and sinus pressure.   Respiratory:  Negative for cough, chest tightness and shortness of breath.   Cardiovascular:  Negative for chest pain, palpitations and leg swelling.  Gastrointestinal:  Negative for abdominal pain, diarrhea, nausea and vomiting.  Genitourinary:  Negative for difficulty urinating and dysuria.  Musculoskeletal:  Negative for joint swelling and myalgias.  Skin:  Negative for color change and rash.  Neurological:  Negative for dizziness.       Headaches improved as outlined.   Psychiatric/Behavioral:  Negative for agitation and dysphoric mood.        Objective:     BP 120/78 (BP Location: Left Arm, Patient Position: Sitting, Cuff Size: Small)   Pulse 78   Temp 98 F (36.7 C) (Temporal)   Resp 16   Ht 5\' 7"  (1.702 m)   Wt 142 lb (64.4 kg)   SpO2 98%   BMI 22.24 kg/m  Wt Readings from Last 3 Encounters:  01/17/22 142 lb (64.4 kg)  11/22/21 140 lb (63.5 kg)  10/11/21 140 lb (63.5 kg)    Physical Exam Vitals reviewed.  Constitutional:      General: She is not in acute distress.    Appearance: Normal appearance.  HENT:  Head: Normocephalic and atraumatic.     Right Ear: External ear normal.     Left Ear: External ear normal.  Eyes:     General: No scleral icterus.       Right eye: No discharge.        Left eye: No discharge.     Conjunctiva/sclera: Conjunctivae normal.  Neck:     Thyroid: No thyromegaly.  Cardiovascular:     Rate and Rhythm: Normal rate and regular rhythm.  Pulmonary:     Effort: No respiratory distress.     Breath sounds: Normal breath sounds. No wheezing.  Abdominal:     General: Bowel sounds are normal.     Palpations: Abdomen is soft.     Tenderness: There is no abdominal tenderness.  Musculoskeletal:        General: No swelling or tenderness.     Cervical back: Neck supple. No tenderness.  Lymphadenopathy:     Cervical: No cervical adenopathy.  Skin:    Findings: No erythema or rash.  Neurological:     Mental  Status: She is alert.  Psychiatric:        Mood and Affect: Mood normal.        Behavior: Behavior normal.      Outpatient Encounter Medications as of 01/17/2022  Medication Sig   Cetirizine HCl 10 MG CAPS    cholecalciferol (VITAMIN D3) 25 MCG (1000 UNIT) tablet    etonogestrel (NEXPLANON) 68 MG IMPL implant Inject into the skin.   gabapentin (NEURONTIN) 300 MG capsule TAKE 1 CAPSULE BY MOUTH EVERYDAY AT BEDTIME   L-Theanine 200 MG CAPS    levothyroxine (SYNTHROID, LEVOTHROID) 100 MCG tablet Take 100 mcg by mouth daily before breakfast.   Lysine 500 MG CAPS Take 1 capsule by mouth daily.   Multiple Vitamins-Minerals (WOMENS MULTI GUMMIES PO)    propranolol (INDERAL) 20 MG tablet TAKE 1 TABLET BY MOUTH 3 TIMES DAILY AS NEEDED (AS NEEDED FOR FAST HEART RATES).   ranitidine (ZANTAC) 75 MG tablet Take 75 mg by mouth as needed.    sertraline (ZOLOFT) 50 MG tablet TAKE 1&1/2 TABLET BY MOUTH EVERY DAY   No facility-administered encounter medications on file as of 01/17/2022.     Lab Results  Component Value Date   WBC 10.8 (H) 12/26/2020   HGB 13.2 12/26/2020   HCT 39.0 12/26/2020   PLT 271.0 12/26/2020   GLUCOSE 85 12/26/2020   ALT 13 12/26/2020   AST 12 12/26/2020   NA 137 12/26/2020   K 3.7 12/26/2020   CL 104 12/26/2020   CREATININE 0.82 12/26/2020   BUN 16 12/26/2020   CO2 26 12/26/2020   TSH 17.40 (H) 04/07/2017    MR Brain W Wo Contrast  Result Date: 01/18/2021 CLINICAL DATA:  Left-sided face, arm, and leg numbness and tingling. Dizziness. EXAM: MRI HEAD WITHOUT AND WITH CONTRAST TECHNIQUE: Multiplanar, multiecho pulse sequences of the brain and surrounding structures were obtained without and with intravenous contrast. CONTRAST:  20mL GADAVIST GADOBUTROL 1 MMOL/ML IV SOLN COMPARISON:  None. FINDINGS: Brain: There is no evidence of an acute infarct, intracranial hemorrhage, mass, midline shift, or extra-axial fluid collection. The ventricles and sulci are normal. The  brain is normal in signal. No abnormal brain parenchymal or meningeal enhancement is identified. A tiny developmental venous anomaly is incidentally noted in the right corona radiata. Vascular: Major intracranial vascular flow voids are preserved. Skull and upper cervical spine: Unremarkable bone marrow signal. Sinuses/Orbits: Unremarkable orbits. Paranasal sinuses and  mastoid air cells are clear. Other: None. IMPRESSION: Negative brain MRI. Electronically Signed   By: Sebastian Ache M.D.   On: 01/18/2021 16:51       Assessment & Plan:   Problem List Items Addressed This Visit     Anxiety - Primary    On zoloft 75mg  q day.   Doing better.  Continue current dose.  Follow.       Bilateral carpal tunnel syndrome    Saw Dr .  S/p injection 10/2021.        Cervical cancer screening    Followed by gyn.       Paroxysmal tachycardia (HCC)    Previously saw Dr 11/2021.  Zio monitor - sinus tachycardia with no significant arrhythmia. On zoloft.  Feels better.  No increased heart rate or palpitations since last visit.  Follow.       Stress    Discussed.  Appears to be doing better.  Continue zoloft at current dose. Follow.       Thyroid nodule    Saw Dr Mariah Milling.  Recommended f/u ultrasound.  F/u ultrasound - 07/2021.  Stable. On synthroid.  Follow.         08/2021, MD

## 2022-01-19 NOTE — Assessment & Plan Note (Signed)
Previously saw Dr Gollan.  Zio monitor - sinus tachycardia with no significant arrhythmia. On zoloft.  Feels better.  No increased heart rate or palpitations since last visit.  Follow.  

## 2022-01-19 NOTE — Assessment & Plan Note (Signed)
Followed by gyn

## 2022-01-19 NOTE — Assessment & Plan Note (Signed)
Saw Dr O'Connell.  Recommended f/u ultrasound.  F/u ultrasound - 07/2021.  Stable. On synthroid.  Follow.  

## 2022-01-19 NOTE — Assessment & Plan Note (Signed)
On zoloft 75mg q day.   Doing better.  Continue current dose.  Follow.  

## 2022-01-19 NOTE — Assessment & Plan Note (Signed)
Discussed.  Appears to be doing better.  Continue zoloft at current dose. Follow.  

## 2022-01-19 NOTE — Assessment & Plan Note (Signed)
Saw Dr Katrinka Blazing.  S/p injection 10/2021.

## 2022-01-27 ENCOUNTER — Other Ambulatory Visit: Payer: Self-pay | Admitting: Internal Medicine

## 2022-01-30 NOTE — Progress Notes (Signed)
[name] [address] [place] [address] Phone: [phone] Subjective:   Bruce Donath, am serving as a scribe for Dr. Antoine Primas.  I'm seeing this patient by the request  of:  Dale [place], MD  CC: Headache, wrist pain follow-up  YHC:[id number]  YEIMY BRABANT is a 36 y.o. female coming in with complaint of back and neck pain. OMT 11/22/2021. Also f/u for B carpal tunnel. Patient states that she has been doing well. No pain in neck, lumbar spine or wrists.   Medications patient has been prescribed: Gabapentin  Taking:         Reviewed prior external information including notes and imaging from previsou exam, outside providers and external EMR if available.   As well as notes that were available from care everywhere and other healthcare systems.  Past medical history, social, surgical and family history all reviewed in electronic medical record.  No pertanent information unless stated regarding to the chief complaint.   Past Medical History:  Diagnosis Date   Carpal tunnel syndrome, bilateral    Hx of varicella    Postpartum care following vaginal delivery (10/28) 12/30/2014   Thyroid disease     No Known Allergies   Review of Systems:  No headache, visual changes, nausea, vomiting, diarrhea, constipation, dizziness, abdominal pain, skin rash, fevers, chills, night sweats, weight loss, swollen lymph nodes, body aches, joint swelling, chest pain, shortness of breath, mood changes. POSITIVE muscle aches  Objective  Blood pressure 112/76, pulse (!) 107, height 5\' 7"  (1.702 m), weight 140 lb (63.5 kg), SpO2 98 %, unknown if currently breastfeeding.   General: No apparent distress alert and oriented x3 mood and affect normal, dressed appropriately.  HEENT: Pupils equal, extraocular movements intact  Respiratory: Patient's speak in full sentences and does not appear short of breath  Cardiovascular: No lower extremity edema,  non tender, no erythema  Low back exam does have some loss of lordosis.  Patient does neck still has some mild tightness noted.  Seems to be mostly at the occipital area on the right side.  Osteopathic findings  C2 flexed rotated and side bent right C6 flexed rotated and side bent le right ft T3 extended rotated and side bent right inhaled rib T9 extended rotated and side bent left L2 flexed rotated and side bent right Sacrum right on right       Assessment and Plan:  Cervicogenic headache Has responded extremely well to osteopathic manipulation.  Discussed with patient to continue the current regimen.  We discussed icing regimen and home exercises, which activities to do and which ones to avoid.  Patient is doing so well we will have patient follow-up with me again in 2 to 3 months.  Continue the gabapentin.  200 mg at night.  Bilateral carpal tunnel syndrome Patient is still doing significantly better at this time.  No need to make any other significant changes.  Can repeat injections if necessary.    Nonallopathic problems  Decision today to treat with OMT was based on Physical Exam  After verbal consent patient was treated with HVLA, ME, FPR techniques in cervical, rib, thoracic, lumbar, and sacral  areas  Patient tolerated the procedure well with improvement in symptoms  Patient given exercises, stretches and lifestyle modifications  See medications in patient instructions if given  Patient will follow up in 4-8 weeks     The above documentation has been reviewed and is accurate and complete  Tamala Julian, DO         Note: This dictation was prepared with Dragon dictation along with smaller phrase technology. Any transcriptional errors that result from this process are unintentional.

## 2022-01-31 ENCOUNTER — Ambulatory Visit: Payer: Self-pay

## 2022-01-31 ENCOUNTER — Ambulatory Visit: Payer: 59 | Admitting: Family Medicine

## 2022-01-31 VITALS — BP 112/76 | HR 107 | Ht 67.0 in | Wt 140.0 lb

## 2022-01-31 DIAGNOSIS — M9901 Segmental and somatic dysfunction of cervical region: Secondary | ICD-10-CM

## 2022-01-31 DIAGNOSIS — M25532 Pain in left wrist: Secondary | ICD-10-CM

## 2022-01-31 DIAGNOSIS — M25531 Pain in right wrist: Secondary | ICD-10-CM | POA: Diagnosis not present

## 2022-01-31 DIAGNOSIS — G5603 Carpal tunnel syndrome, bilateral upper limbs: Secondary | ICD-10-CM

## 2022-01-31 DIAGNOSIS — M9902 Segmental and somatic dysfunction of thoracic region: Secondary | ICD-10-CM

## 2022-01-31 DIAGNOSIS — M9904 Segmental and somatic dysfunction of sacral region: Secondary | ICD-10-CM

## 2022-01-31 DIAGNOSIS — G4486 Cervicogenic headache: Secondary | ICD-10-CM | POA: Diagnosis not present

## 2022-01-31 DIAGNOSIS — M9903 Segmental and somatic dysfunction of lumbar region: Secondary | ICD-10-CM

## 2022-01-31 DIAGNOSIS — M9908 Segmental and somatic dysfunction of rib cage: Secondary | ICD-10-CM

## 2022-01-31 NOTE — Assessment & Plan Note (Signed)
Patient is still doing significantly better at this time.  No need to make any other significant changes.  Can repeat injections if necessary.

## 2022-01-31 NOTE — Assessment & Plan Note (Signed)
Has responded extremely well to osteopathic manipulation.  Discussed with patient to continue the current regimen.  We discussed icing regimen and home exercises, which activities to do and which ones to avoid.  Patient is doing so well we will have patient follow-up with me again in 2 to 3 months.  Continue the gabapentin.  200 mg at night.

## 2022-01-31 NOTE — Patient Instructions (Signed)
You are looking great  Be proud of your self See me in 2-3 months

## 2022-02-25 ENCOUNTER — Other Ambulatory Visit: Payer: Self-pay | Admitting: Family Medicine

## 2022-03-19 ENCOUNTER — Other Ambulatory Visit: Payer: Self-pay | Admitting: Internal Medicine

## 2022-04-09 NOTE — Progress Notes (Signed)
[place] Phone: [phone] Subjective:   Fontaine No, am serving as a scribe for Dr. Hulan Saas.  I'm seeing this patient by the request  of:  Einar Pheasant, MD  CC: Back and neck pain follow-up  GEX:[id number]  Shelby Turner is a 37 y.o. female coming in with complaint of back and neck pain. OMT 02/11/2022. Also f/u for B wrist pain. Patient states overall doing very well.  He does have some discomfort from time to time but nothing severe.  Nothing that is stopped her from activity.  Does feel that the repetitive motion at work can sometimes unfortunately get more discomfort.  Medications patient has been prescribed: Gabapentin  Taking:         Reviewed prior external information including notes and imaging from previsou exam, outside providers and external EMR if available.   As well as notes that were available from care everywhere and other healthcare systems.  Past medical history, social, surgical and family history all reviewed in electronic medical record.  No pertanent information unless stated regarding to the chief complaint.   Past Medical History:  Diagnosis Date   Carpal tunnel syndrome, bilateral    Hx of varicella    Postpartum care following vaginal delivery (10/28) 12/30/2014   Thyroid disease     No Known Allergies   Review of Systems:  No headache, visual changes, nausea, vomiting, diarrhea, constipation, dizziness, abdominal pain, skin rash, fevers, chills, night sweats, weight loss, swollen lymph nodes, body aches, joint swelling, chest pain, shortness of breath, mood changes. POSITIVE muscle aches  Objective  Blood pressure 128/72, pulse (!) 109, height 5\' 7"  (1.702 m), weight 142 lb (64.4 kg), SpO2 99 %, unknown if currently breastfeeding.   General: No apparent distress alert and oriented x3 mood and affect normal, dressed appropriately.  HEENT: Pupils equal,  extraocular movements intact  Respiratory: Patient's speak in full sentences and does not appear short of breath  Cardiovascular: No lower extremity edema, non tender, no erythema  Gait MSK:  Back does have some loss of lordosis noted.  Some tenderness to palpation noted more in the thoracolumbar juncture and more in the scapular area on the right side.  Some very limited sidebending of the neck on the right.  Osteopathic findings  C2 flexed rotated and side bent right C5 flexed rotated and side bent right T3 extended rotated and side bent right inhaled rib L2 flexed rotated and side bent right Sacrum right on right       Assessment and Plan:  Cervicogenic headache Likely lady headaches have been very well controlled at the moment.  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  Increase activity slowly otherwise.  Follow-up again in 8-10 weeks    Nonallopathic problems  Decision today to treat with OMT was based on Physical Exam  After verbal consent patient was treated with HVLA, ME, FPR techniques in cervical, rib, thoracic, lumbar, and sacral  areas  Patient tolerated the procedure well with improvement in symptoms  Patient given exercises, stretches and lifestyle modifications  See medications in patient instructions if given  Patient will follow up in 8-10 weeks     The above documentation has been reviewed and is accurate and complete Lyndal Pulley, DO  Note: This dictation was prepared with Dragon dictation along with smaller phrase technology. Any transcriptional errors that result from this process are unintentional.

## 2022-04-11 ENCOUNTER — Ambulatory Visit (INDEPENDENT_AMBULATORY_CARE_PROVIDER_SITE_OTHER): Payer: 59 | Admitting: Family Medicine

## 2022-04-11 ENCOUNTER — Ambulatory Visit: Payer: Self-pay

## 2022-04-11 VITALS — BP 128/72 | HR 109 | Ht 67.0 in | Wt 142.0 lb

## 2022-04-11 DIAGNOSIS — M9904 Segmental and somatic dysfunction of sacral region: Secondary | ICD-10-CM

## 2022-04-11 DIAGNOSIS — M9902 Segmental and somatic dysfunction of thoracic region: Secondary | ICD-10-CM

## 2022-04-11 DIAGNOSIS — M25532 Pain in left wrist: Secondary | ICD-10-CM

## 2022-04-11 DIAGNOSIS — G4486 Cervicogenic headache: Secondary | ICD-10-CM

## 2022-04-11 DIAGNOSIS — M25531 Pain in right wrist: Secondary | ICD-10-CM

## 2022-04-11 DIAGNOSIS — M9903 Segmental and somatic dysfunction of lumbar region: Secondary | ICD-10-CM | POA: Diagnosis not present

## 2022-04-11 DIAGNOSIS — M9908 Segmental and somatic dysfunction of rib cage: Secondary | ICD-10-CM | POA: Diagnosis not present

## 2022-04-11 DIAGNOSIS — M9901 Segmental and somatic dysfunction of cervical region: Secondary | ICD-10-CM

## 2022-04-11 NOTE — Assessment & Plan Note (Signed)
Likely lady headaches have been very well controlled at the moment.  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  Increase activity slowly otherwise.  Follow-up again in 8-10 weeks

## 2022-04-11 NOTE — Patient Instructions (Signed)
Great to see you No big changes See me again in 9 weeks

## 2022-05-01 IMAGING — MR MR HEAD WO/W CM
17 series · 48 of 48 positions shown · IV contrast (gadavist)
Comparison: None.

CLINICAL DATA: Left-sided face, arm, and leg numbness and tingling.
Dizziness.

EXAM:
MRI HEAD WITHOUT AND WITH CONTRAST
TECHNIQUE: Multiplanar, multiecho pulse sequences of the brain and surrounding
structures were obtained without and with intravenous contrast.
CONTRAST:  6mL GADAVIST GADOBUTROL 1 MMOL/ML IV SOLN

[Series 5: ax dwi_tracew · axial · 3.0mm · 0.65mm/px · z∈[-84,+71]mm · 6 of 95 slices shown]
[im 1/95]
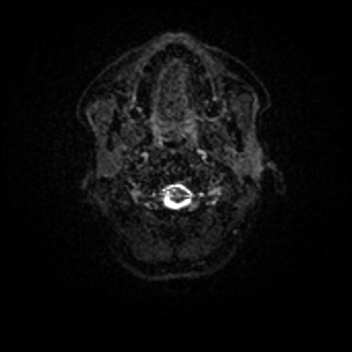
[im 19/95]
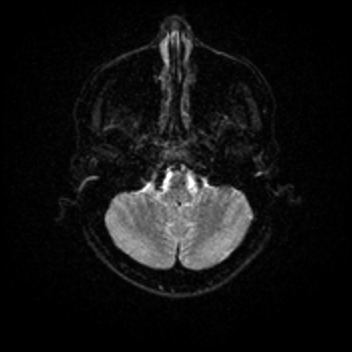
[im 38/95]
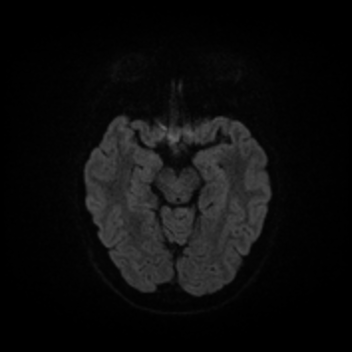
[im 57/95]
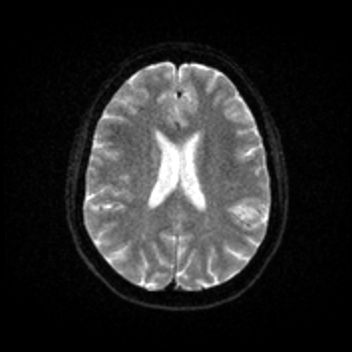
[im 76/95]
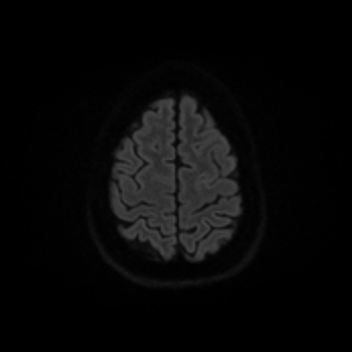
[im 95/95]
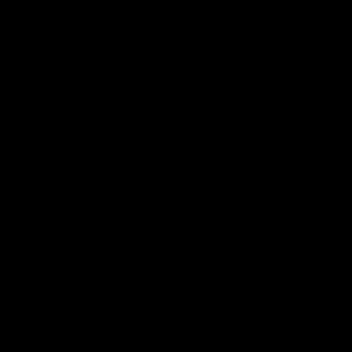

[Series 6: ax dwi_adc · axial · 3.0mm · 0.65mm/px · z∈[-84,+64]mm · 2 of 46 slices shown]
[im 1/46]
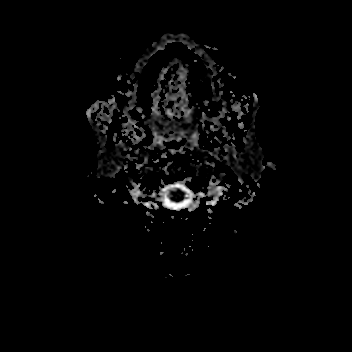
[im 46/46]
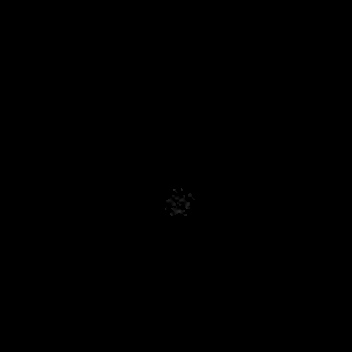

[Series 7: cor dwi_tracew · coronal · 5.0mm · 1.80mm/px · 3 of 68 slices shown]
[im 1/68]
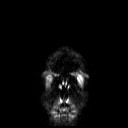
[im 34/68]
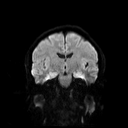
[im 68/68]
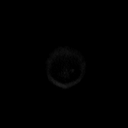

[Series 8: cor dwi_adc · coronal · 5.0mm · 1.80mm/px · 2 of 34 slices shown]
[im 1/34]
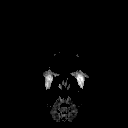
[im 34/34]
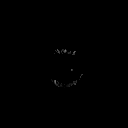

[Series 9: T1 · sagittal · 5.0mm · 0.62mm/px · 1 of 20 slices shown (1 of 3)]
[im 1/20]
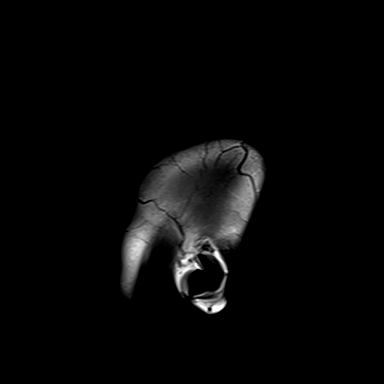

[Series 10: FLAIR · sagittal · 5.0mm · 0.94mm/px · 1 of 20 slices shown (1 of 2)]
[im 1/20]
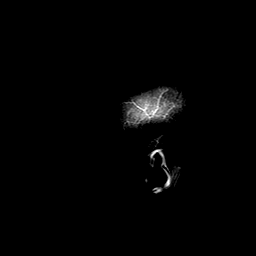

[Series 11: T2 · axial · 5.0mm · 0.53mm/px · 1 of 25 slices shown]
[im 1/25]
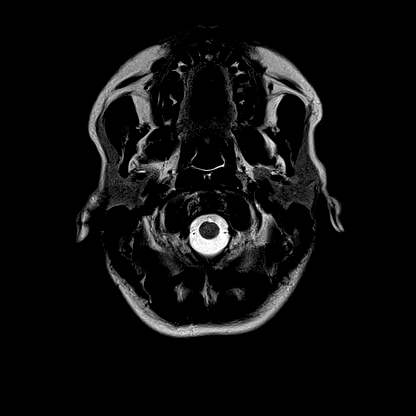

[Series 12: mag_images · axial · 3.0mm · 0.90mm/px · z∈[-94,+82]mm · 3 of 60 slices shown]
[im 1/60]
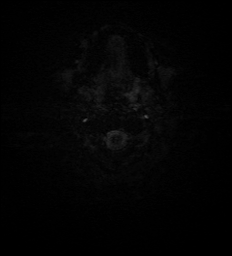
[im 30/60]
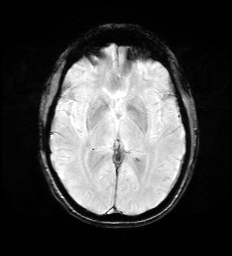
[im 60/60]
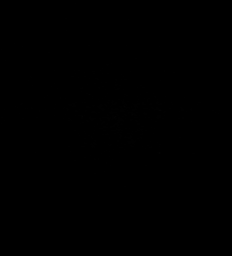

[Series 13: pha_images · axial · 3.0mm · 0.90mm/px · z∈[-94,+82]mm · 3 of 58 slices shown]
[im 1/58]
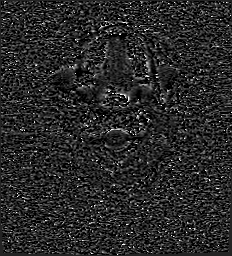
[im 29/58]
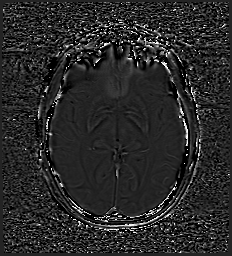
[im 58/58]
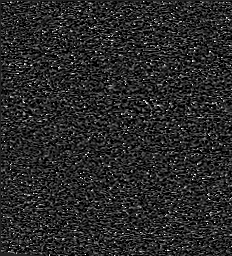

[Series 14: swi_images · axial · 3.0mm · 0.90mm/px · z∈[-94,+82]mm · 3 of 60 slices shown]
[im 1/60]
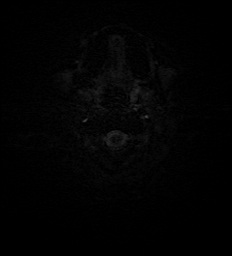
[im 30/60]
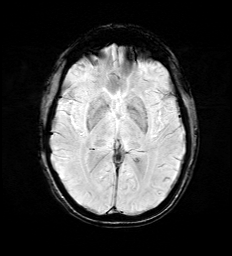
[im 60/60]
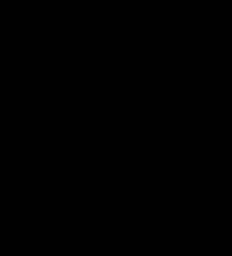

[Series 15: mip_images(sw) · axial · 24.0mm · 0.90mm/px · z∈[-83,+72]mm · 3 of 53 slices shown]
[im 1/53]
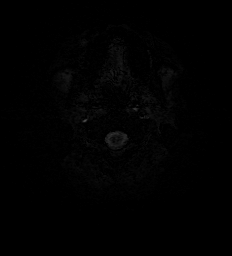
[im 27/53]
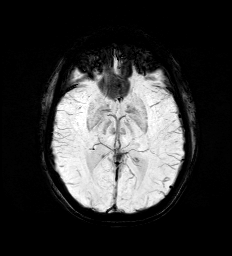
[im 53/53]
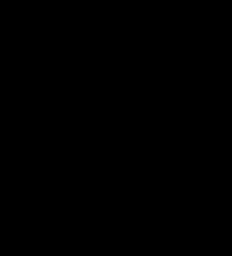

[Series 16: FLAIR · axial · 3.0mm · 0.53mm/px · z∈[-86,+75]mm · 3 of 55 slices shown (2 of 2)]
[im 1/55]
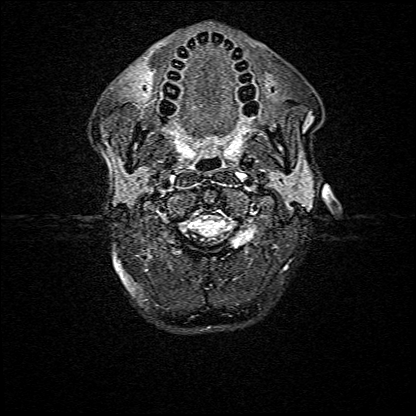
[im 28/55]
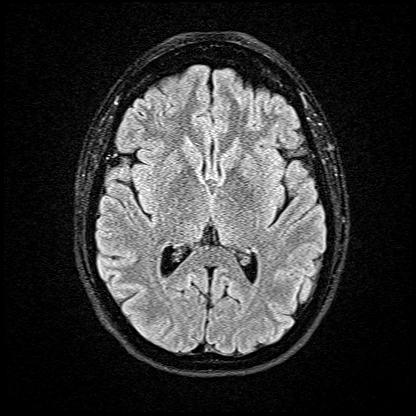
[im 55/55]
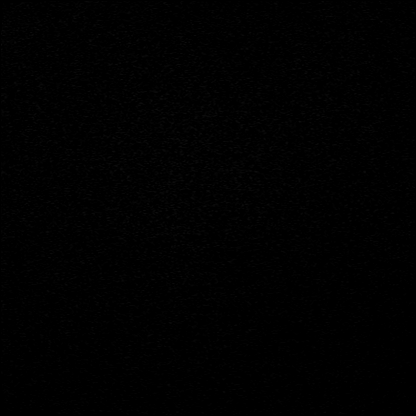

[Series 17: T1 · axial · 1.0mm · 0.98mm/px · z∈[-78,+64]mm · 7 of 144 slices shown (2 of 3)]
[im 1/144]
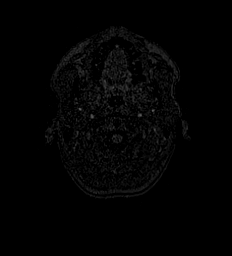
[im 24/144]
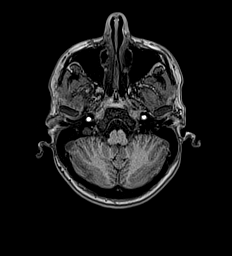
[im 48/144]
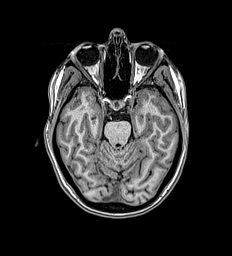
[im 72/144]
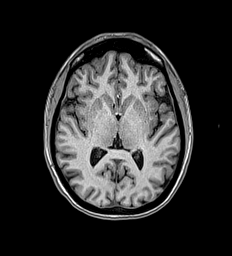
[im 96/144]
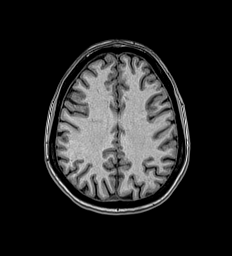
[im 120/144]
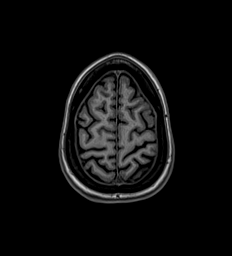
[im 144/144]
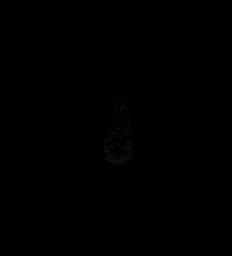

[Series 18: T2 post-contrast · coronal · 5.0mm · 0.57mm/px · 1 of 27 slices shown]
[im 1/27]
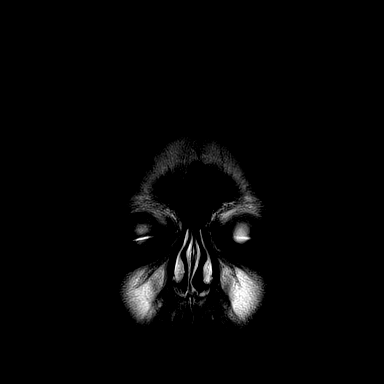

[Series 19: T1 post-contrast · axial · 1.0mm · 0.98mm/px · z∈[-78,+64]mm · 7 of 144 slices shown (1 of 2)]
[im 1/144]
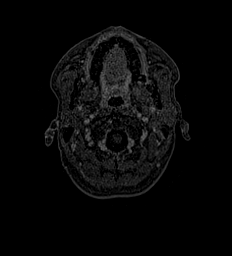
[im 24/144]
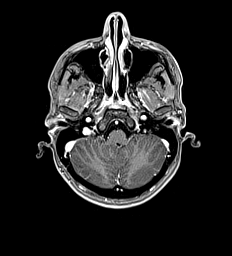
[im 48/144]
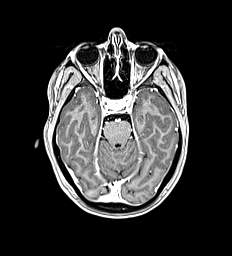
[im 72/144]
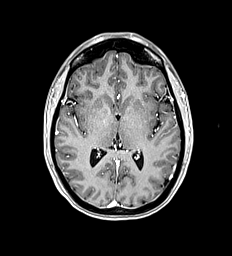
[im 96/144]
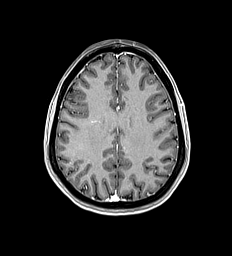
[im 120/144]
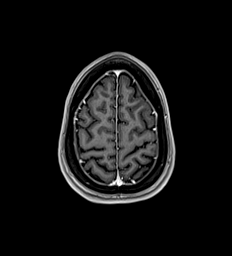
[im 144/144]
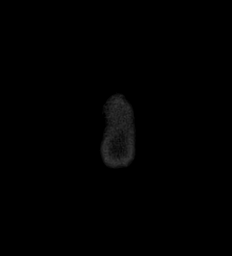

[Series 20: T1 · sagittal · 5.0mm · 0.62mm/px · 1 of 20 slices shown (3 of 3)]
[im 1/20]
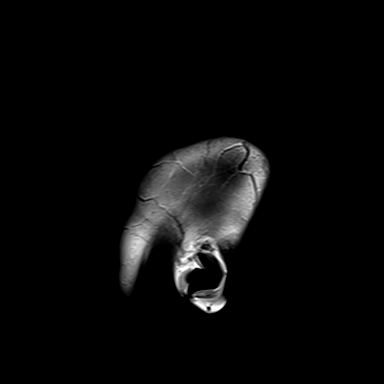

[Series 21: T1 post-contrast · coronal · 5.0mm · 0.57mm/px · 1 of 27 slices shown (2 of 2)]
[im 1/27]
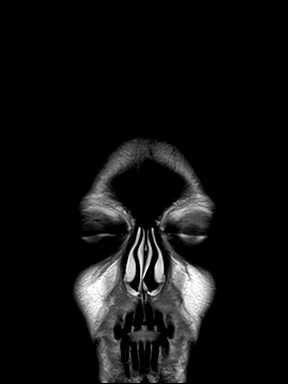

[48 of 48 positions shown; findings below may reference images not displayed]

FINDINGS: Brain: There is no evidence of an acute infarct, intracranial
hemorrhage, mass, midline shift, or extra-axial fluid collection.
The ventricles and sulci are normal. The brain is normal in signal.
No abnormal brain parenchymal or meningeal enhancement is
identified. A tiny developmental venous anomaly is incidentally
noted in the right corona radiata.

Vascular: Major intracranial vascular flow voids are preserved.

Skull and upper cervical spine: Unremarkable bone marrow signal.

Sinuses/Orbits: Unremarkable orbits. Paranasal sinuses and mastoid
air cells are clear.

Other: None.
IMPRESSION: Negative brain MRI.

## 2022-05-23 ENCOUNTER — Encounter: Payer: Self-pay | Admitting: Internal Medicine

## 2022-05-23 ENCOUNTER — Ambulatory Visit (INDEPENDENT_AMBULATORY_CARE_PROVIDER_SITE_OTHER): Payer: 59 | Admitting: Internal Medicine

## 2022-05-23 VITALS — BP 108/70 | HR 81 | Temp 98.3°F | Resp 16 | Ht 67.0 in | Wt 143.6 lb

## 2022-05-23 DIAGNOSIS — H938X2 Other specified disorders of left ear: Secondary | ICD-10-CM

## 2022-05-23 DIAGNOSIS — G4486 Cervicogenic headache: Secondary | ICD-10-CM

## 2022-05-23 DIAGNOSIS — I479 Paroxysmal tachycardia, unspecified: Secondary | ICD-10-CM

## 2022-05-23 DIAGNOSIS — F419 Anxiety disorder, unspecified: Secondary | ICD-10-CM | POA: Diagnosis not present

## 2022-05-23 DIAGNOSIS — E041 Nontoxic single thyroid nodule: Secondary | ICD-10-CM | POA: Diagnosis not present

## 2022-05-23 DIAGNOSIS — F439 Reaction to severe stress, unspecified: Secondary | ICD-10-CM

## 2022-05-23 MED ORDER — PIMECROLIMUS 1 % EX CREA: TOPICAL_CREAM | Freq: Two times a day (BID) | CUTANEOUS | 0 refills | Status: DC

## 2022-05-23 MED ORDER — SERTRALINE HCL 50 MG PO TABS: 75.0000 mg | ORAL_TABLET | Freq: Every day | ORAL | 1 refills | Status: DC

## 2022-05-23 NOTE — Progress Notes (Unsigned)
Subjective:    Patient ID: Shelby Turner, female    DOB: 1985/11/14, 37 y.o.   MRN: [id number]  Patient here for  Chief Complaint  Patient presents with   Medical Management of Chronic Issues    HPI Here to follow up regarding increased stress and headaches.  On zoloft.  Feels zoloft is working well. Left ear has been stopped up. No pain. Taking allegra.  Discussed steroid nasal spray. No chest pain or sob reported.  No cough or congestion.  No abdominal pain or bowel change reported.  Seeing Dr Tamala Julian - headache - icing, home exercises.  Continues on gabapentin.  Doing better.    Past Medical History:  Diagnosis Date   Carpal tunnel syndrome, bilateral    Hx of varicella    Postpartum care following vaginal delivery (10/28) 12/30/2014   Thyroid disease    Past Surgical History:  Procedure Laterality Date   IUD REMOVAL     implanted in uterus   Family History  Problem Relation Age of Onset   Kidney Stones Maternal Aunt    Kidney Stones Maternal Grandfather    Cancer Maternal Grandfather        basal cell carcinoma   Heart Problems Maternal Grandfather    COPD Paternal Grandfather    Social History   Socioeconomic History   Marital status: Married    Spouse name: Not on file   Number of children: Not on file   Years of education: Not on file   Highest education level: Not on file  Occupational History   Not on file  Tobacco Use   Smoking status: Never   Smokeless tobacco: Never  Substance and Sexual Activity   Alcohol use: No   Drug use: No   Sexual activity: Yes  Other Topics Concern   Not on file  Social History Narrative   Not on file   Social Determinants of Health   Financial Resource Strain: Not on file  Food Insecurity: Not on file  Transportation Needs: Not on file  Physical Activity: Not on file  Stress: Not on file  Social Connections: Not on file     Review of Systems  Constitutional:  Negative for appetite change and unexpected weight  change.  HENT:  Negative for congestion and sinus pressure.   Respiratory:  Negative for cough, chest tightness and shortness of breath.   Cardiovascular:  Negative for chest pain and palpitations.  Gastrointestinal:  Negative for abdominal pain, diarrhea, nausea and vomiting.  Genitourinary:  Negative for difficulty urinating and dysuria.  Musculoskeletal:  Negative for joint swelling and myalgias.  Skin:  Negative for color change and rash.  Neurological:  Negative for dizziness.       Headaches better.   Psychiatric/Behavioral:  Negative for agitation and dysphoric mood.        Objective:     BP 108/70   Pulse 81   Temp 98.3 F (36.8 C)   Resp 16   Ht 5\' 7"  (1.702 m)   Wt 143 lb 9.6 oz (65.1 kg)   SpO2 99%   BMI 22.49 kg/m  Wt Readings from Last 3 Encounters:  05/23/22 143 lb 9.6 oz (65.1 kg)  04/11/22 142 lb (64.4 kg)  01/31/22 140 lb (63.5 kg)    Physical Exam Vitals reviewed.  Constitutional:      General: She is not in acute distress.    Appearance: Normal appearance.  HENT:     Head: Normocephalic and atraumatic.  Right Ear: External ear normal.     Left Ear: External ear normal.  Eyes:     General: No scleral icterus.       Right eye: No discharge.        Left eye: No discharge.     Conjunctiva/sclera: Conjunctivae normal.  Neck:     Thyroid: No thyromegaly.  Cardiovascular:     Rate and Rhythm: Normal rate and regular rhythm.  Pulmonary:     Effort: No respiratory distress.     Breath sounds: Normal breath sounds. No wheezing.  Abdominal:     General: Bowel sounds are normal.     Palpations: Abdomen is soft.     Tenderness: There is no abdominal tenderness.  Musculoskeletal:        General: No swelling or tenderness.     Cervical back: Neck supple. No tenderness.  Lymphadenopathy:     Cervical: No cervical adenopathy.  Skin:    Findings: No erythema or rash.  Neurological:     Mental Status: She is alert.  Psychiatric:        Mood and  Affect: Mood normal.        Behavior: Behavior normal.      Outpatient Encounter Medications as of 05/23/2022  Medication Sig   pimecrolimus (ELIDEL) 1 % cream Apply topically 2 (two) times daily.   Cetirizine HCl 10 MG CAPS    cholecalciferol (VITAMIN D3) 25 MCG (1000 UNIT) tablet    etonogestrel (NEXPLANON) 68 MG IMPL implant Inject into the skin.   gabapentin (NEURONTIN) 300 MG capsule TAKE 1 CAPSULE BY MOUTH EVERYDAY AT BEDTIME   L-Theanine 200 MG CAPS    levothyroxine (SYNTHROID, LEVOTHROID) 100 MCG tablet Take 100 mcg by mouth daily before breakfast.   Lysine 500 MG CAPS Take 1 capsule by mouth daily.   Multiple Vitamins-Minerals (WOMENS MULTI GUMMIES PO)    propranolol (INDERAL) 20 MG tablet TAKE 1 TABLET BY MOUTH 3 TIMES DAILY AS NEEDED (AS NEEDED FOR FAST HEART RATES).   ranitidine (ZANTAC) 75 MG tablet Take 75 mg by mouth as needed.    sertraline (ZOLOFT) 50 MG tablet Take 1.5 tablets (75 mg total) by mouth daily.   [DISCONTINUED] sertraline (ZOLOFT) 50 MG tablet TAKE 1&1/2 TABLET BY MOUTH EVERY DAY   No facility-administered encounter medications on file as of 05/23/2022.     Lab Results  Component Value Date   WBC 10.8 (H) 12/26/2020   HGB 13.2 12/26/2020   HCT 39.0 12/26/2020   PLT 271.0 12/26/2020   GLUCOSE 85 12/26/2020   ALT 13 12/26/2020   AST 12 12/26/2020   NA 137 12/26/2020   K 3.7 12/26/2020   CL 104 12/26/2020   CREATININE 0.82 12/26/2020   BUN 16 12/26/2020   CO2 26 12/26/2020   TSH 17.40 (H) 04/07/2017    MR Brain W Wo Contrast  Result Date: 01/18/2021 CLINICAL DATA:  Left-sided face, arm, and leg numbness and tingling. Dizziness. EXAM: MRI HEAD WITHOUT AND WITH CONTRAST TECHNIQUE: Multiplanar, multiecho pulse sequences of the brain and surrounding structures were obtained without and with intravenous contrast. CONTRAST:  44mL GADAVIST GADOBUTROL 1 MMOL/ML IV SOLN COMPARISON:  None. FINDINGS: Brain: There is no evidence of an acute infarct,  intracranial hemorrhage, mass, midline shift, or extra-axial fluid collection. The ventricles and sulci are normal. The brain is normal in signal. No abnormal brain parenchymal or meningeal enhancement is identified. A tiny developmental venous anomaly is incidentally noted in the right corona radiata. Vascular:  Major intracranial vascular flow voids are preserved. Skull and upper cervical spine: Unremarkable bone marrow signal. Sinuses/Orbits: Unremarkable orbits. Paranasal sinuses and mastoid air cells are clear. Other: None. IMPRESSION: Negative brain MRI. Electronically Signed   By: Logan Bores M.D.   On: 01/18/2021 16:51       Assessment & Plan:  Paroxysmal tachycardia [facility]) Assessment & Plan: Previously saw Dr Rockey Situ.  Zio monitor - sinus tachycardia with no significant arrhythmia. On zoloft.  Feels better.  No increased heart rate or palpitations since last visit.  Follow.   Orders: -     CBC with Differential/Platelet; Future -     Comprehensive metabolic panel; Future -     Lipid panel; Future  Anxiety Assessment & Plan: On zoloft 75mg  q day.   Doing better.  Continue current dose.  Follow.    Thyroid nodule Assessment & Plan: Saw Dr Honor Junes.  Recommended f/u ultrasound.  F/u ultrasound - 07/2021.  Stable. On synthroid.  Follow.    Cervicogenic headache Assessment & Plan: Seeing Dr Tamala Julian - headache - icing, home exercises.  Continues on gabapentin.  Doing better.    Stress Assessment & Plan: Discussed.  Appears to be doing better.  Continue zoloft at current dose. Follow.    Fullness in ear, left Assessment & Plan: Continue antihistamine.  Steroid nasal spray.  Follow.  Call with update.    Other orders -     Sertraline HCl; Take 1.5 tablets (75 mg total) by mouth daily.  Dispense: 135 tablet; Refill: 1 -     Pimecrolimus; Apply topically 2 (two) times daily.  Dispense: 30 g; Refill: 0     Einar Pheasant, MD

## 2022-05-24 ENCOUNTER — Encounter: Payer: Self-pay | Admitting: Internal Medicine

## 2022-05-24 DIAGNOSIS — H938X2 Other specified disorders of left ear: Secondary | ICD-10-CM | POA: Insufficient documentation

## 2022-05-24 NOTE — Assessment & Plan Note (Signed)
Discussed.  Appears to be doing better.  Continue zoloft at current dose. Follow.

## 2022-05-24 NOTE — Assessment & Plan Note (Signed)
Previously saw Dr Rockey Situ.  Zio monitor - sinus tachycardia with no significant arrhythmia. On zoloft.  Feels better.  No increased heart rate or palpitations since last visit.  Follow.

## 2022-05-24 NOTE — Assessment & Plan Note (Signed)
Saw Dr Honor Junes.  Recommended f/u ultrasound.  F/u ultrasound - 07/2021.  Stable. On synthroid.  Follow.

## 2022-05-24 NOTE — Assessment & Plan Note (Signed)
On zoloft 75mg  q day.   Doing better.  Continue current dose.  Follow.

## 2022-05-24 NOTE — Assessment & Plan Note (Signed)
Continue antihistamine.  Steroid nasal spray.  Follow.  Call with update.

## 2022-05-24 NOTE — Assessment & Plan Note (Signed)
Seeing Dr Tamala Julian - headache - icing, home exercises.  Continues on gabapentin.  Doing better.

## 2022-05-27 ENCOUNTER — Encounter: Payer: Self-pay | Admitting: Internal Medicine

## 2022-05-27 DIAGNOSIS — Z0000 Encounter for general adult medical examination without abnormal findings: Secondary | ICD-10-CM | POA: Insufficient documentation

## 2022-05-30 ENCOUNTER — Other Ambulatory Visit: Payer: Self-pay | Admitting: Family Medicine

## 2022-06-06 ENCOUNTER — Ambulatory Visit: Payer: 59 | Admitting: Family Medicine

## 2022-07-03 NOTE — Progress Notes (Signed)
[name] [address] [place] [address] Phone: [phone] Subjective:   [name], am serving as a scribe for Dr. Antoine Primas.  I'm seeing this patient by the request  of:  Dale [place], MD  CC: Back and neck pain follow-up  [id number]  YSABELLA DEMAINE is a 37 y.o. female coming in with complaint of back and neck pain. OMT 04/11/2022. Patient states doing well. Same per usual. Starting to feeling some pain in R wrist, but not overwhelming. L wrist is doing the sporadic numbness and tingling. No other concerns.  Medications patient has been prescribed: Gabapentin  Taking:         Reviewed prior external information including notes and imaging from previsou exam, outside providers and external EMR if available.   As well as notes that were available from care everywhere and other healthcare systems.  Past medical history, social, surgical and family history all reviewed in electronic medical record.  No pertanent information unless stated regarding to the chief complaint.   Past Medical History:  Diagnosis Date   Carpal tunnel syndrome, bilateral    Hx of varicella    Postpartum care following vaginal delivery (10/28) 12/30/2014   Thyroid disease     No Known Allergies   Review of Systems:  No  visual changes, nausea, vomiting, diarrhea, constipation, dizziness, abdominal pain, skin rash, fevers, chills, night sweats, weight loss, swollen lymph nodes, body aches, joint swelling, chest pain, shortness of breath, mood changes. POSITIVE muscle aches, headache  Objective  Blood pressure 104/60, pulse 84, height 5\' 7"  (1.702 m), weight 145 lb (65.8 kg), SpO2 98 %, unknown if currently breastfeeding.   General: No apparent distress alert and oriented x3 mood and affect normal, dressed appropriately.  HEENT: Pupils equal, extraocular movements intact  Respiratory: Patient's speak in full sentences and does not appear short  of breath  Cardiovascular: No lower extremity edema, non tender, no erythema  Neck exam does have some mild loss lordosis noted.  Some tenderness to palpation in the paraspinal musculature. Wrist exam negative Tinel's though noted today.  Osteopathic findings  C3 flexed rotated and side bent right C7 flexed rotated and side bent left T3 extended rotated and side bent right inhaled rib T6 extended rotated and side bent left L1 flexed rotated and side bent right Sacrum right on right     Assessment and Plan:  Bilateral carpal tunnel syndrome Started noticing increasing symptoms again.  Encouraged increasing injections at follow-up.  Follow-up again in 2 to 3 months.  Cervicogenic headache At this time.  Does have the gabapentin if needed.  Responding extremely well to osteopathic manipulation.  Worsening pain can consider possible other medications if needed.  Doing well.  Continue work on posture at work and follow-up again in 6 to 8 weeks    Nonallopathic problems  Decision today to treat with OMT was based on Physical Exam  After verbal consent patient was treated with HVLA, ME, FPR techniques in cervical, rib, thoracic, lumbar, and sacral  areas  Patient tolerated the procedure well with improvement in symptoms  Patient given exercises, stretches and lifestyle modifications  See medications in patient instructions if given  Patient will follow up in 4-8 weeks    The above documentation has been reviewed and is accurate and complete Judi Saa, DO          Note: This dictation was prepared with Dragon dictation along with smaller phrase technology. Any  transcriptional errors that result from this process are unintentional.

## 2022-07-04 ENCOUNTER — Encounter: Payer: Self-pay | Admitting: Family Medicine

## 2022-07-04 ENCOUNTER — Ambulatory Visit: Payer: 59 | Admitting: Family Medicine

## 2022-07-04 VITALS — BP 104/60 | HR 84 | Ht 67.0 in | Wt 145.0 lb

## 2022-07-04 DIAGNOSIS — G4486 Cervicogenic headache: Secondary | ICD-10-CM | POA: Diagnosis not present

## 2022-07-04 DIAGNOSIS — M9901 Segmental and somatic dysfunction of cervical region: Secondary | ICD-10-CM

## 2022-07-04 DIAGNOSIS — M9903 Segmental and somatic dysfunction of lumbar region: Secondary | ICD-10-CM

## 2022-07-04 DIAGNOSIS — M9904 Segmental and somatic dysfunction of sacral region: Secondary | ICD-10-CM | POA: Diagnosis not present

## 2022-07-04 DIAGNOSIS — G5603 Carpal tunnel syndrome, bilateral upper limbs: Secondary | ICD-10-CM | POA: Diagnosis not present

## 2022-07-04 DIAGNOSIS — M9908 Segmental and somatic dysfunction of rib cage: Secondary | ICD-10-CM | POA: Diagnosis not present

## 2022-07-04 DIAGNOSIS — M9902 Segmental and somatic dysfunction of thoracic region: Secondary | ICD-10-CM

## 2022-07-04 NOTE — Assessment & Plan Note (Signed)
Started noticing increasing symptoms again.  Encouraged increasing injections at follow-up.  Follow-up again in 2 to 3 months.

## 2022-07-04 NOTE — Patient Instructions (Signed)
Good to see you! Thanks for the [place] idea Watch for the positioning at work See you again in 2 months

## 2022-07-04 NOTE — Assessment & Plan Note (Signed)
At this time.  Does have the gabapentin if needed.  Responding extremely well to osteopathic manipulation.  Worsening pain can consider possible other medications if needed.  Doing well.  Continue work on posture at work and follow-up again in 6 to 8 weeks

## 2022-09-03 ENCOUNTER — Other Ambulatory Visit: Payer: Self-pay | Admitting: Family Medicine

## 2022-09-08 NOTE — Progress Notes (Signed)
[name] [address] [place] [address] Phone: [phone] Subjective:   [name], am serving as a scribe for Dr. Antoine Primas.  I'm seeing this patient by the request  of:  Dale [place], MD  CC: Neck pain, headache follow-up  [id number]  Shelby Turner is a 37 y.o. female coming in with complaint of B wrist pain. OMT last visit also. Patient states she is actually doing very well today.  Has had some tightness recently.  Some mild increase in stress with patient having difficulty with her boat .     Past Medical History:  Diagnosis Date   Carpal tunnel syndrome, bilateral    Hx of varicella    Postpartum care following vaginal delivery (10/28) 12/30/2014   Thyroid disease    Past Surgical History:  Procedure Laterality Date   IUD REMOVAL     implanted in uterus   Social History   Socioeconomic History   Marital status: Married    Spouse name: Not on file   Number of children: Not on file   Years of education: Not on file   Highest education level: Not on file  Occupational History   Not on file  Tobacco Use   Smoking status: Never   Smokeless tobacco: Never  Substance and Sexual Activity   Alcohol use: No   Drug use: No   Sexual activity: Yes  Other Topics Concern   Not on file  Social History Narrative   Not on file   Social Determinants of Health   Financial Resource Strain: Not on file  Food Insecurity: Not on file  Transportation Needs: Not on file  Physical Activity: Not on file  Stress: Not on file  Social Connections: Not on file   No Known Allergies Family History  Problem Relation Age of Onset   Kidney Stones Maternal Aunt    Kidney Stones Maternal Grandfather    Cancer Maternal Grandfather        basal cell carcinoma   Heart Problems Maternal Grandfather    COPD Paternal Grandfather     Current Outpatient Medications (Endocrine & Metabolic):    etonogestrel (NEXPLANON) 68 MG IMPL  implant, Inject into the skin.   levothyroxine (SYNTHROID, LEVOTHROID) 100 MCG tablet, Take 100 mcg by mouth daily before breakfast.  Current Outpatient Medications (Cardiovascular):    propranolol (INDERAL) 20 MG tablet, TAKE 1 TABLET BY MOUTH 3 TIMES DAILY AS NEEDED (AS NEEDED FOR FAST HEART RATES).  Current Outpatient Medications (Respiratory):    Cetirizine HCl 10 MG CAPS,     Current Outpatient Medications (Other):    cholecalciferol (VITAMIN D3) 25 MCG (1000 UNIT) tablet,    gabapentin (NEURONTIN) 300 MG capsule, TAKE 1 CAPSULE BY MOUTH EVERYDAY AT BEDTIME   L-Theanine 200 MG CAPS,    Lysine 500 MG CAPS, Take 1 capsule by mouth daily.   Multiple Vitamins-Minerals (WOMENS MULTI GUMMIES PO),    pimecrolimus (ELIDEL) 1 % cream, Apply topically 2 (two) times daily.   ranitidine (ZANTAC) 75 MG tablet, Take 75 mg by mouth as needed.    sertraline (ZOLOFT) 50 MG tablet, Take 1.5 tablets (75 mg total) by mouth daily.   Reviewed prior external information including notes and imaging from  primary care provider As well as notes that were available from care everywhere and other healthcare systems.  Past medical history, social, surgical and family history all reviewed in electronic medical record.  No pertanent information unless stated regarding  to the chief complaint.   Review of Systems:  No  visual changes, nausea, vomiting, diarrhea, constipation, dizziness, abdominal pain, skin rash, fevers, chills, night sweats, weight loss, swollen lymph nodes, , joint swelling, chest pain, shortness of breath, mood changes. POSITIVE muscle aches, body aches, headache   Objective  Blood pressure 110/70, pulse 80, height 5\' 7"  (1.702 m), weight 145 lb (65.8 kg), SpO2 98%, unknown if currently breastfeeding.   General: No apparent distress alert and oriented x3 mood and affect normal, dressed appropriately.  HEENT: Pupils equal, extraocular movements intact  Respiratory: Patient's speak in full  sentences and does not appear short of breath  Cardiovascular: No lower extremity edema, non tender, no erythema  Have tightness noted in the paraspinal musculature.  Patient does have mild tightness noted in the lower back as well.  Osteopathic findings C2 flexed rotated and side bent right C4 flexed rotated and side bent left C6 flexed rotated and side bent left T4 extended rotated and side bent right inhaled third rib T5 extended rotated and side bent left L2 flexed rotated and side bent right Sacrum right on right     Impression and Recommendations:    The above documentation has been reviewed and is accurate and complete Judi Saa, DO

## 2022-09-12 ENCOUNTER — Other Ambulatory Visit: Payer: Self-pay

## 2022-09-12 ENCOUNTER — Encounter: Payer: Self-pay | Admitting: Family Medicine

## 2022-09-12 ENCOUNTER — Ambulatory Visit: Payer: 59 | Admitting: Family Medicine

## 2022-09-12 VITALS — BP 110/70 | HR 80 | Ht 67.0 in | Wt 145.0 lb

## 2022-09-12 DIAGNOSIS — G4486 Cervicogenic headache: Secondary | ICD-10-CM | POA: Diagnosis not present

## 2022-09-12 DIAGNOSIS — M9904 Segmental and somatic dysfunction of sacral region: Secondary | ICD-10-CM

## 2022-09-12 DIAGNOSIS — M9902 Segmental and somatic dysfunction of thoracic region: Secondary | ICD-10-CM

## 2022-09-12 DIAGNOSIS — M9903 Segmental and somatic dysfunction of lumbar region: Secondary | ICD-10-CM

## 2022-09-12 DIAGNOSIS — M9901 Segmental and somatic dysfunction of cervical region: Secondary | ICD-10-CM

## 2022-09-12 DIAGNOSIS — G5603 Carpal tunnel syndrome, bilateral upper limbs: Secondary | ICD-10-CM

## 2022-09-12 DIAGNOSIS — M9908 Segmental and somatic dysfunction of rib cage: Secondary | ICD-10-CM

## 2022-09-12 NOTE — Assessment & Plan Note (Signed)
Doing relatively well at the moment.  Discussed icing regimen and home exercises.  Discussed which activities could be causing some exacerbations.  Has had some increasing stress recently.  Patient will not make any other significant changes of the medications or treatment at the moment.  Follow-up with me again in 6 to 8 weeks only because patient is traveling.  If patient continues to do well then we will decrease frequency of intervals.

## 2022-09-12 NOTE — Patient Instructions (Signed)
Good to see you  Thanks for the laugh  Have a great time in [place] See me again in 6-7 weeks

## 2022-09-19 ENCOUNTER — Ambulatory Visit: Payer: 59 | Admitting: Internal Medicine

## 2022-10-16 ENCOUNTER — Encounter (INDEPENDENT_AMBULATORY_CARE_PROVIDER_SITE_OTHER): Payer: Self-pay

## 2022-10-23 NOTE — Progress Notes (Signed)
  [facility] [address] [place] [address] Phone: [phone] Subjective:   Shelby Turner, am serving as a scribe for Dr. Antoine Primas.  I'm seeing this patient by the request  of:  Dale [place], MD  CC: Bilateral carpal tunnel, back and neck pain follow-up  [id number]  Shelby Turner is a 37 y.o. female coming in with complaint of back and neck pain. OMT 09/12/2022. Also f/u for B carpal tunnel. Patient states L wrist will go numb or tingle every now and again, but doing well. No new concerns.  Medications patient has been prescribed: gabapentin  Taking:         Reviewed prior external information including notes and imaging from previsou exam, outside providers and external EMR if available.   As well as notes that were available from care everywhere and other healthcare systems.  Past medical history, social, surgical and family history all reviewed in electronic medical record.  No pertanent information unless stated regarding to the chief complaint.   Past Medical History:  Diagnosis Date   Carpal tunnel syndrome, bilateral    Hx of varicella    Postpartum care following vaginal delivery (10/28) 12/30/2014   Thyroid disease     No Known Allergies   Review of Systems:  No  visual changes, nausea, vomiting, diarrhea, constipation, dizziness, abdominal pain, skin rash, fevers, chills, night sweats, weight loss, swollen lymph nodes, body aches, joint swelling, chest pain, shortness of breath, mood changes. POSITIVE muscle aches, headache  Objective  Blood pressure 108/66, pulse 82, height 5\' 7"  (1.702 m), weight 149 lb (67.6 kg), SpO2 98%, unknown if currently breastfeeding.   General: No apparent distress alert and oriented x3 mood and affect normal, dressed appropriately.  HEENT: Pupils equal, extraocular movements intact  Respiratory: Patient's speak in full sentences and does not appear short of breath   Cardiovascular: No lower extremity edema, non tender, no erythema  Gait MSK:  Back   Osteopathic findings  C2 flexed rotated and side bent right C6 flexed rotated and side bent left T3 extended rotated and side bent left inhaled rib L2 flexed rotated and side bent left Sacrum right on right       Assessment and Plan:  Cervicogenic headache Doing well at this time.  Nothing that stopping her from activity.  Discussed icing regimen and home exercises, discussed which activities to do and which ones to avoid still.  Patient did do some traveling and is feeling much better overall now.  Follow-up again in 6 to 8 weeks    Nonallopathic problems  Decision today to treat with OMT was based on Physical Exam  After verbal consent patient was treated with HVLA, ME, FPR techniques in cervical, rib, thoracic, lumbar, and sacral  areas  Patient tolerated the procedure well with improvement in symptoms  Patient given exercises, stretches and lifestyle modifications  See medications in patient instructions if given  Patient will follow up in 8 weeks     The above documentation has been reviewed and is accurate and complete Shelby Saa, DO         Note: This dictation was prepared with Dragon dictation along with smaller phrase technology. Any transcriptional errors that result from this process are unintentional.

## 2022-10-24 ENCOUNTER — Ambulatory Visit: Payer: 59 | Admitting: Family Medicine

## 2022-10-24 VITALS — BP 108/66 | HR 82 | Ht 67.0 in | Wt 149.0 lb

## 2022-10-24 DIAGNOSIS — M9902 Segmental and somatic dysfunction of thoracic region: Secondary | ICD-10-CM

## 2022-10-24 DIAGNOSIS — M9908 Segmental and somatic dysfunction of rib cage: Secondary | ICD-10-CM | POA: Diagnosis not present

## 2022-10-24 DIAGNOSIS — M9903 Segmental and somatic dysfunction of lumbar region: Secondary | ICD-10-CM | POA: Diagnosis not present

## 2022-10-24 DIAGNOSIS — G4486 Cervicogenic headache: Secondary | ICD-10-CM | POA: Diagnosis not present

## 2022-10-24 DIAGNOSIS — M9901 Segmental and somatic dysfunction of cervical region: Secondary | ICD-10-CM

## 2022-10-24 DIAGNOSIS — M9904 Segmental and somatic dysfunction of sacral region: Secondary | ICD-10-CM

## 2022-10-24 NOTE — Assessment & Plan Note (Signed)
Doing well at this time.  Nothing that stopping her from activity.  Discussed icing regimen and home exercises, discussed which activities to do and which ones to avoid still.  Patient did do some traveling and is feeling much better overall now.  Follow-up again in 6 to 8 weeks

## 2022-10-24 NOTE — Patient Instructions (Signed)
You are amazing [employer] are now my travel agents See you again in 2 months

## 2022-11-14 ENCOUNTER — Ambulatory Visit: Payer: 59 | Admitting: Internal Medicine

## 2022-11-14 ENCOUNTER — Encounter: Payer: Self-pay | Admitting: Internal Medicine

## 2022-11-14 VITALS — BP 114/70 | HR 75 | Temp 98.8°F | Ht 68.0 in | Wt 148.6 lb

## 2022-11-14 DIAGNOSIS — G4486 Cervicogenic headache: Secondary | ICD-10-CM

## 2022-11-14 DIAGNOSIS — E041 Nontoxic single thyroid nodule: Secondary | ICD-10-CM

## 2022-11-14 DIAGNOSIS — Z124 Encounter for screening for malignant neoplasm of cervix: Secondary | ICD-10-CM | POA: Diagnosis not present

## 2022-11-14 DIAGNOSIS — I479 Paroxysmal tachycardia, unspecified: Secondary | ICD-10-CM | POA: Diagnosis not present

## 2022-11-14 DIAGNOSIS — F439 Reaction to severe stress, unspecified: Secondary | ICD-10-CM

## 2022-11-14 DIAGNOSIS — F419 Anxiety disorder, unspecified: Secondary | ICD-10-CM | POA: Diagnosis not present

## 2022-11-14 MED ORDER — SERTRALINE HCL 50 MG PO TABS: 75.0000 mg | ORAL_TABLET | Freq: Every day | ORAL | 1 refills | Status: DC

## 2022-11-14 NOTE — Assessment & Plan Note (Addendum)
Followed by gyn. Planning to call [facility] for f/u.

## 2022-11-14 NOTE — Assessment & Plan Note (Signed)
Discussed.  Appears to be doing better.  Continue zoloft at current dose. Follow.

## 2022-11-14 NOTE — Assessment & Plan Note (Signed)
On zoloft 75mg  q day.   Doing better.  Stable. Continue current dose.  Follow.

## 2022-11-14 NOTE — Assessment & Plan Note (Signed)
Previously saw Dr Mariah Milling.  Zio monitor - sinus tachycardia with no significant arrhythmia. On zoloft.  Feels better.  No increased heart rate or palpitations since last visit.  Follow.

## 2022-11-14 NOTE — Assessment & Plan Note (Signed)
Saw Dr Gershon Crane. Stable.  Recommended f/u ultrasound and labs - one year. On synthroid.  Follow.

## 2022-11-14 NOTE — Assessment & Plan Note (Signed)
Seeing Dr Katrinka Blazing - headache - icing, home exercises.  Continues on gabapentin.  Doing better.

## 2022-11-14 NOTE — Progress Notes (Signed)
Subjective:    Patient ID: Shelby Turner, female    DOB: 1985/12/25, 37 y.o.   MRN: [id number]  Patient here for  Chief Complaint  Patient presents with   Medication Management    HPI Here to follow up regarding increased stress and headaches. On zoloft. Feels zoloft is working well. Does not feel needs any further intervention.  Has good support. Seeing Dr Katrinka Blazing for cervicogenic headache  - icing, home exercises. Doing much better.  Continues on gabapentin. Saw Dr Gershon Crane 06/06/22 - thyroid stable.  Recommended f/u with labs and ultrasound. Stays active. No chest pain or sob reported.  No abdominal pain.  Bowels moving with her current regimen.    Past Medical History:  Diagnosis Date   Carpal tunnel syndrome, bilateral    Hx of varicella    Postpartum care following vaginal delivery (10/28) 12/30/2014   Thyroid disease    Past Surgical History:  Procedure Laterality Date   IUD REMOVAL     implanted in uterus   Family History  Problem Relation Age of Onset   Kidney Stones Maternal Aunt    Kidney Stones Maternal Grandfather    Cancer Maternal Grandfather        basal cell carcinoma   Heart Problems Maternal Grandfather    COPD Paternal Grandfather    Social History   Socioeconomic History   Marital status: Married    Spouse name: Not on file   Number of children: Not on file   Years of education: Not on file   Highest education level: Not on file  Occupational History   Not on file  Tobacco Use   Smoking status: Never   Smokeless tobacco: Never  Substance and Sexual Activity   Alcohol use: No   Drug use: No   Sexual activity: Yes  Other Topics Concern   Not on file  Social History Narrative   Not on file   Social Determinants of Health   Financial Resource Strain: Not on file  Food Insecurity: Not on file  Transportation Needs: Not on file  Physical Activity: Not on file  Stress: Not on file  Social Connections: Not on file     Review of Systems   Constitutional:  Negative for appetite change and unexpected weight change.  HENT:  Negative for congestion and sinus pressure.   Respiratory:  Negative for cough, chest tightness and shortness of breath.   Cardiovascular:  Negative for chest pain, palpitations and leg swelling.  Gastrointestinal:  Negative for abdominal pain, diarrhea, nausea and vomiting.  Genitourinary:  Negative for difficulty urinating and dysuria.  Musculoskeletal:  Negative for joint swelling and myalgias.  Skin:  Negative for color change and rash.  Neurological:  Negative for dizziness and headaches.  Psychiatric/Behavioral:  Negative for agitation and dysphoric mood.        Objective:     BP 114/70   Pulse 75   Temp 98.8 F (37.1 C) (Oral)   Ht 5\' 8"  (1.727 m)   Wt 148 lb 9.6 oz (67.4 kg)   SpO2 99%   BMI 22.59 kg/m  Wt Readings from Last 3 Encounters:  11/14/22 148 lb 9.6 oz (67.4 kg)  10/24/22 149 lb (67.6 kg)  09/12/22 145 lb (65.8 kg)    Physical Exam Vitals reviewed.  Constitutional:      General: She is not in acute distress.    Appearance: Normal appearance.  HENT:     Head: Normocephalic and atraumatic.  Right Ear: External ear normal.     Left Ear: External ear normal.  Eyes:     General: No scleral icterus.       Right eye: No discharge.        Left eye: No discharge.     Conjunctiva/sclera: Conjunctivae normal.  Neck:     Thyroid: No thyromegaly.  Cardiovascular:     Rate and Rhythm: Normal rate and regular rhythm.  Pulmonary:     Effort: No respiratory distress.     Breath sounds: Normal breath sounds. No wheezing.  Abdominal:     General: Bowel sounds are normal.     Palpations: Abdomen is soft.     Tenderness: There is no abdominal tenderness.  Musculoskeletal:        General: No swelling or tenderness.     Cervical back: Neck supple. No tenderness.  Lymphadenopathy:     Cervical: No cervical adenopathy.  Skin:    Findings: No erythema or rash.  Neurological:      Mental Status: She is alert.  Psychiatric:        Mood and Affect: Mood normal.        Behavior: Behavior normal.      Outpatient Encounter Medications as of 11/14/2022  Medication Sig   Cetirizine HCl 10 MG CAPS    cholecalciferol (VITAMIN D3) 25 MCG (1000 UNIT) tablet    etonogestrel (NEXPLANON) 68 MG IMPL implant Inject into the skin.   gabapentin (NEURONTIN) 300 MG capsule TAKE 1 CAPSULE BY MOUTH EVERYDAY AT BEDTIME   L-Theanine 200 MG CAPS    levothyroxine (SYNTHROID, LEVOTHROID) 100 MCG tablet Take 100 mcg by mouth daily before breakfast.   Lysine 500 MG CAPS Take 1 capsule by mouth daily.   Multiple Vitamins-Minerals (WOMENS MULTI GUMMIES PO)    pimecrolimus (ELIDEL) 1 % cream Apply topically 2 (two) times daily.   propranolol (INDERAL) 20 MG tablet TAKE 1 TABLET BY MOUTH 3 TIMES DAILY AS NEEDED (AS NEEDED FOR FAST HEART RATES).   ranitidine (ZANTAC) 75 MG tablet Take 75 mg by mouth as needed.    sertraline (ZOLOFT) 50 MG tablet Take 1.5 tablets (75 mg total) by mouth daily.   [DISCONTINUED] sertraline (ZOLOFT) 50 MG tablet Take 1.5 tablets (75 mg total) by mouth daily.   No facility-administered encounter medications on file as of 11/14/2022.     Lab Results  Component Value Date   WBC 10.8 (H) 12/26/2020   HGB 13.2 12/26/2020   HCT 39.0 12/26/2020   PLT 271.0 12/26/2020   GLUCOSE 85 12/26/2020   ALT 13 12/26/2020   AST 12 12/26/2020   NA 137 12/26/2020   K 3.7 12/26/2020   CL 104 12/26/2020   CREATININE 0.82 12/26/2020   BUN 16 12/26/2020   CO2 26 12/26/2020   TSH 17.40 (H) 04/07/2017    MR Brain W Wo Contrast  Result Date: 01/18/2021 CLINICAL DATA:  Left-sided face, arm, and leg numbness and tingling. Dizziness. EXAM: MRI HEAD WITHOUT AND WITH CONTRAST TECHNIQUE: Multiplanar, multiecho pulse sequences of the brain and surrounding structures were obtained without and with intravenous contrast. CONTRAST:  6mL GADAVIST GADOBUTROL 1 MMOL/ML IV SOLN COMPARISON:   None. FINDINGS: Brain: There is no evidence of an acute infarct, intracranial hemorrhage, mass, midline shift, or extra-axial fluid collection. The ventricles and sulci are normal. The brain is normal in signal. No abnormal brain parenchymal or meningeal enhancement is identified. A tiny developmental venous anomaly is incidentally noted in the right corona  radiata. Vascular: Major intracranial vascular flow voids are preserved. Skull and upper cervical spine: Unremarkable bone marrow signal. Sinuses/Orbits: Unremarkable orbits. Paranasal sinuses and mastoid air cells are clear. Other: None. IMPRESSION: Negative brain MRI. Electronically Signed   By: Sebastian Ache M.D.   On: 01/18/2021 16:51       Assessment & Plan:  Anxiety Assessment & Plan: On zoloft 75mg  q day.   Doing better.  Stable. Continue current dose.  Follow.    Cervical cancer screening Assessment & Plan: Followed by gyn. Planning to call [facility] for f/u.    Cervicogenic headache Assessment & Plan: Seeing Dr Katrinka Blazing - headache - icing, home exercises.  Continues on gabapentin.  Doing better.    Paroxysmal tachycardia (HCC) Assessment & Plan: Previously saw Dr Mariah Milling.  Zio monitor - sinus tachycardia with no significant arrhythmia. On zoloft.  Feels better.  No increased heart rate or palpitations since last visit.  Follow.    Stress Assessment & Plan: Discussed.  Appears to be doing better.  Continue zoloft at current dose. Follow.    Thyroid nodule Assessment & Plan: Saw Dr Gershon Crane. Stable.  Recommended f/u ultrasound and labs - one year. On synthroid.  Follow.    Other orders -     Sertraline HCl; Take 1.5 tablets (75 mg total) by mouth daily.  Dispense: 135 tablet; Refill: 1     Dale Playita Cortada, MD

## 2022-11-21 ENCOUNTER — Ambulatory Visit: Payer: 59 | Admitting: Internal Medicine

## 2022-12-17 ENCOUNTER — Other Ambulatory Visit: Payer: Self-pay | Admitting: Family Medicine

## 2022-12-18 NOTE — Progress Notes (Signed)
[name] [address] [place] [address] Phone: [phone] Subjective:   Shelby Turner, am serving as a scribe for Dr. Antoine Primas.  I'm seeing this patient by the request  of:  Dale Bixby, MD  CC: Back and neck pain follow-up  [id number]  Shelby Turner is a 37 y.o. female coming in with complaint of back and neck pain. OMT 10/24/2022. Patient states   Medications patient has been prescribed: None  Taking:         Reviewed prior external information including notes and imaging from previsou exam, outside providers and external EMR if available.   As well as notes that were available from care everywhere and other healthcare systems.  Past medical history, social, surgical and family history all reviewed in electronic medical record.  No pertanent information unless stated regarding to the chief complaint.   Past Medical History:  Diagnosis Date   Carpal tunnel syndrome, bilateral    Hx of varicella    Postpartum care following vaginal delivery (10/28) 12/30/2014   Thyroid disease     No Known Allergies   Review of Systems:  No headache, visual changes, nausea, vomiting, diarrhea, constipation, dizziness, abdominal pain, skin rash, fevers, chills, night sweats, weight loss, swollen lymph nodes, body aches, joint swelling, chest pain, shortness of breath, mood changes. POSITIVE muscle aches  Objective  Blood pressure 122/72, pulse 85, height 5\' 8"  (1.727 m), weight 146 lb (66.2 kg), SpO2 97%, unknown if currently breastfeeding.   General: No apparent distress alert and oriented x3 mood and affect normal, dressed appropriately.  HEENT: Pupils equal, extraocular movements intact  Respiratory: Patient's speak in full sentences and does not appear short of breath  Cardiovascular: No lower extremity edema, non tender, no erythema  Gait MSK:  Back does have some mild loss of lordosis noted.  Tightness noted more on the  paraspinal musculature of the right sacroiliac joint.  Tightness in the right parascapular area going all the way up to the neck as well in the occipital region.  Negative Spurling's test noted today.  Osteopathic findings  C2 flexed rotated and side bent right C7 flexed rotated and side bent right T3 extended rotated and side bent right inhaled rib T9 extended rotated and side bent left L2 flexed rotated and side bent right Sacrum right on right    Assessment and Plan:  Cervicogenic headache Patient has made significant improvement at this time.  Still takes the gabapentin once more on an intermittent and daily basis.  Discussed icing regimen and home exercises.  Increase activity slowly.  Discussed which activities to do and which ones to avoid.  Increase activity slowly.  Follow-up again in 6 to 8 weeks    Nonallopathic problems  Decision today to treat with OMT was based on Physical Exam  After verbal consent patient was treated with HVLA, ME, FPR techniques in cervical, rib, thoracic, lumbar, and sacral  areas  Patient tolerated the procedure well with improvement in symptoms  Patient given exercises, stretches and lifestyle modifications  See medications in patient instructions if given  Patient will follow up in 8 weeks      The above documentation has been reviewed and is accurate and complete Judi Saa, DO        Note: This dictation was prepared with Dragon dictation along with smaller phrase technology. Any transcriptional errors that result from this process are unintentional.

## 2022-12-19 ENCOUNTER — Encounter: Payer: Self-pay | Admitting: Family Medicine

## 2022-12-19 ENCOUNTER — Ambulatory Visit: Payer: 59 | Admitting: Family Medicine

## 2022-12-19 VITALS — BP 122/72 | HR 85 | Ht 68.0 in | Wt 146.0 lb

## 2022-12-19 DIAGNOSIS — M9901 Segmental and somatic dysfunction of cervical region: Secondary | ICD-10-CM

## 2022-12-19 DIAGNOSIS — M9904 Segmental and somatic dysfunction of sacral region: Secondary | ICD-10-CM | POA: Diagnosis not present

## 2022-12-19 DIAGNOSIS — G4486 Cervicogenic headache: Secondary | ICD-10-CM

## 2022-12-19 DIAGNOSIS — M9903 Segmental and somatic dysfunction of lumbar region: Secondary | ICD-10-CM | POA: Diagnosis not present

## 2022-12-19 DIAGNOSIS — M9908 Segmental and somatic dysfunction of rib cage: Secondary | ICD-10-CM

## 2022-12-19 DIAGNOSIS — M9902 Segmental and somatic dysfunction of thoracic region: Secondary | ICD-10-CM

## 2022-12-19 NOTE — Patient Instructions (Signed)
Good to see you See me in 2-3 months

## 2022-12-19 NOTE — Assessment & Plan Note (Signed)
Patient has made significant improvement at this time.  Still takes the gabapentin once more on an intermittent and daily basis.  Discussed icing regimen and home exercises.  Increase activity slowly.  Discussed which activities to do and which ones to avoid.  Increase activity slowly.  Follow-up again in 6 to 8 weeks

## 2023-03-25 NOTE — Progress Notes (Signed)
  [name] [address] [place] [address] Phone: [phone] Subjective:   Shelby Turner, am serving as a scribe for Dr. Antoine Primas.  I'm seeing this patient by the request  of:  Dale Milner, MD  CC: Back and neck pain follow-up  [id number]  Shelby Turner is a 39 y.o. female coming in with complaint of back and neck pain. OMT 12/19/2022. Carpal tunnel injection August 2023. Patient states same per usual. No new concerns.  Medications patient has been prescribed: Gabapentin  Taking:         Reviewed prior external information including notes and imaging from previsou exam, outside providers and external EMR if available.   As well as notes that were available from care everywhere and other healthcare systems.  Past medical history, social, surgical and family history all reviewed in electronic medical record.  No pertanent information unless stated regarding to the chief complaint.   Past Medical History:  Diagnosis Date   Carpal tunnel syndrome, bilateral    Hx of varicella    Postpartum care following vaginal delivery (10/28) 12/30/2014   Thyroid disease     No Known Allergies   Review of Systems:  No , visual changes, nausea, vomiting, diarrhea, constipation, dizziness, abdominal pain, skin rash, fevers, chills, night sweats, weight loss, swollen lymph nodes, body aches, joint swelling, chest pain, shortness of breath, mood changes. POSITIVE muscle aches, headache  Objective  Blood pressure 112/70, pulse 86, height 5\' 8"  (1.727 m), weight 149 lb (67.6 kg), SpO2 98%, unknown if currently breastfeeding.   General: No apparent distress alert and oriented x3 mood and affect normal, dressed appropriately.  HEENT: Pupils equal, extraocular movements intact  Respiratory: Patient's speak in full sentences and does not appear short of breath  Cardiovascular: No lower extremity edema, non tender, no erythema  MSK:  Back  does have what appears to be more of a pelvic shift noted.  Favoring the right side.  In addition to this though patient's neck does have some tightness still noted left greater than right.  Some limited sidebending on the left.  Osteopathic findings  C2 flexed rotated and side bent right C6 flexed rotated and side bent left T3 extended rotated and side bent right inhaled rib T9 extended rotated and side bent left L2 flexed rotated and side bent right Sacrum bilateral flexion noted.   Assessment and Plan:  Cervicogenic headache Doing well at this time.  Neck seems to be a little bit better than what it was previously.  Did have some pelvic shift noted that we will monitor.  Differential includes possible osteitis pubis.  Increase activity slowly otherwise.  Follow-up with me again 6 to 8 weeks    Nonallopathic problems  Decision today to treat with OMT was based on Physical Exam  After verbal consent patient was treated with HVLA, ME, FPR techniques in cervical, rib, thoracic, lumbar, and sacral  areas  Patient tolerated the procedure well with improvement in symptoms  Patient given exercises, stretches and lifestyle modifications  See medications in patient instructions if given  Patient will follow up in 4-8 weeks     The above documentation has been reviewed and is accurate and complete Judi Saa, DO         Note: This dictation was prepared with Dragon dictation along with smaller phrase technology. Any transcriptional errors that result from this process are unintentional.

## 2023-03-27 ENCOUNTER — Encounter: Payer: Self-pay | Admitting: Family Medicine

## 2023-03-27 ENCOUNTER — Ambulatory Visit: Payer: 59 | Admitting: Family Medicine

## 2023-03-27 VITALS — BP 112/70 | HR 86 | Ht 68.0 in | Wt 149.0 lb

## 2023-03-27 DIAGNOSIS — M9901 Segmental and somatic dysfunction of cervical region: Secondary | ICD-10-CM

## 2023-03-27 DIAGNOSIS — M9908 Segmental and somatic dysfunction of rib cage: Secondary | ICD-10-CM

## 2023-03-27 DIAGNOSIS — M9902 Segmental and somatic dysfunction of thoracic region: Secondary | ICD-10-CM

## 2023-03-27 DIAGNOSIS — M9904 Segmental and somatic dysfunction of sacral region: Secondary | ICD-10-CM

## 2023-03-27 DIAGNOSIS — M9903 Segmental and somatic dysfunction of lumbar region: Secondary | ICD-10-CM

## 2023-03-27 DIAGNOSIS — G4486 Cervicogenic headache: Secondary | ICD-10-CM | POA: Diagnosis not present

## 2023-03-27 NOTE — Assessment & Plan Note (Signed)
Doing well at this time.  Neck seems to be a little bit better than what it was previously.  Did have some pelvic shift noted that we will monitor.  Differential includes possible osteitis pubis.  Increase activity slowly otherwise.  Follow-up with me again 6 to 8 weeks

## 2023-03-27 NOTE — Patient Instructions (Signed)
Good to see you! Watch the pelvis See you again in 2 months

## 2023-04-04 ENCOUNTER — Other Ambulatory Visit: Payer: Self-pay | Admitting: Family Medicine

## 2023-05-15 ENCOUNTER — Encounter: Payer: Self-pay | Admitting: Internal Medicine

## 2023-05-15 ENCOUNTER — Ambulatory Visit: Payer: 59 | Admitting: Internal Medicine

## 2023-05-15 VITALS — BP 118/78 | HR 78 | Temp 97.7°F | Ht 68.0 in | Wt 148.8 lb

## 2023-05-15 DIAGNOSIS — G4486 Cervicogenic headache: Secondary | ICD-10-CM

## 2023-05-15 DIAGNOSIS — E041 Nontoxic single thyroid nodule: Secondary | ICD-10-CM | POA: Diagnosis not present

## 2023-05-15 DIAGNOSIS — Z13220 Encounter for screening for lipoid disorders: Secondary | ICD-10-CM

## 2023-05-15 DIAGNOSIS — I479 Paroxysmal tachycardia, unspecified: Secondary | ICD-10-CM

## 2023-05-15 DIAGNOSIS — F419 Anxiety disorder, unspecified: Secondary | ICD-10-CM

## 2023-05-15 DIAGNOSIS — Z01419 Encounter for gynecological examination (general) (routine) without abnormal findings: Secondary | ICD-10-CM

## 2023-05-15 DIAGNOSIS — F439 Reaction to severe stress, unspecified: Secondary | ICD-10-CM | POA: Diagnosis not present

## 2023-05-15 DIAGNOSIS — H6120 Impacted cerumen, unspecified ear: Secondary | ICD-10-CM | POA: Insufficient documentation

## 2023-05-15 DIAGNOSIS — H6122 Impacted cerumen, left ear: Secondary | ICD-10-CM

## 2023-05-15 LAB — COMPREHENSIVE METABOLIC PANEL
ALT: 14 U/L (ref 0–35)
AST: 13 U/L (ref 0–37)
Albumin: 4.2 g/dL (ref 3.5–5.2)
Alkaline Phosphatase: 50 U/L (ref 39–117)
BUN: 16 mg/dL (ref 6–23)
CO2: 27 meq/L (ref 19–32)
Calcium: 8.8 mg/dL (ref 8.4–10.5)
Chloride: 103 meq/L (ref 96–112)
Creatinine, Ser: 0.75 mg/dL (ref 0.40–1.20)
GFR: 100.72 mL/min (ref >60.00)
Glucose, Bld: 86 mg/dL (ref 70–99)
Potassium: 3.6 meq/L (ref 3.5–5.1)
Sodium: 138 meq/L (ref 135–145)
Total Bilirubin: 0.4 mg/dL (ref 0.2–1.2)
Total Protein: 7.1 g/dL (ref 6.0–8.3)

## 2023-05-15 LAB — CBC WITH DIFFERENTIAL/PLATELET
Basophils Absolute: 0.1 10*3/uL (ref 0.0–0.1)
Basophils Relative: 0.6 % (ref 0.0–3.0)
Eosinophils Absolute: 0.2 10*3/uL (ref 0.0–0.7)
Eosinophils Relative: 1.7 % (ref 0.0–5.0)
HCT: 38.7 % (ref 36.0–46.0)
Hemoglobin: 13.1 g/dL (ref 12.0–15.0)
Lymphocytes Relative: 26 % (ref 12.0–46.0)
Lymphs Abs: 2.8 10*3/uL (ref 0.7–4.0)
MCHC: 34 g/dL (ref 30.0–36.0)
MCV: 86.2 fl (ref 78.0–100.0)
Monocytes Absolute: 0.7 10*3/uL (ref 0.1–1.0)
Monocytes Relative: 6.8 % (ref 3.0–12.0)
Neutro Abs: 6.9 10*3/uL (ref 1.4–7.7)
Neutrophils Relative %: 64.9 % (ref 43.0–77.0)
Platelets: 238 10*3/uL (ref 150.0–400.0)
RBC: 4.49 Mil/uL (ref 3.87–5.11)
RDW: 13 % (ref 11.5–15.5)
WBC: 10.6 10*3/uL — ABNORMAL HIGH (ref 4.0–10.5)

## 2023-05-15 LAB — LIPID PANEL
Cholesterol: 175 mg/dL (ref 0–200)
HDL: 60.5 mg/dL (ref >39.00)
LDL Cholesterol: 93 mg/dL (ref 0–99)
NonHDL: 114.87
Total CHOL/HDL Ratio: 3
Triglycerides: 109 mg/dL (ref 0.0–149.0)
VLDL: 21.8 mg/dL (ref 0.0–40.0)

## 2023-05-15 LAB — TSH: TSH: 1.4 u[IU]/mL (ref 0.35–5.50)

## 2023-05-15 MED ORDER — SERTRALINE HCL 50 MG PO TABS: 75.0000 mg | ORAL_TABLET | Freq: Every day | ORAL | 1 refills | Status: DC

## 2023-05-15 NOTE — Assessment & Plan Note (Signed)
 Saw Dr Gershon Crane. Stable.  Recommended f/u ultrasound and labs - one year. On synthroid.  Follow. Will check tsh today since drawing labs and forward to Dr Val EagleElveria Rising.

## 2023-05-15 NOTE — Assessment & Plan Note (Signed)
 Debrox as directed. Follow.  Flonase as directed.

## 2023-05-15 NOTE — Progress Notes (Signed)
 Subjective:    Patient ID: Shelby Turner, female    DOB: [birth year], 38 y.o.   MRN: [id number]  Patient here for a scheduled follow up.   HPI Here to follow up regarding increased stress and headaches. On zoloft. Saw Dr Gershon Crane 06/06/22 - thyroid stable. Recommended f/u with labs and ultrasound. Seeing Dr Katrinka Blazing - cervicogenic headache. Stable. Mostly has issues with her headaches around her menstrual period. Overall doing well. Handling stress. Breathing stable. No cough or congestion. Previous sinus. Overall has essentially resolved, but does report - bilateral ear fullness. No decrease in hearing. Bowels stable. Request referral to gyn locally - her gyn is cutting back seeing pts.     Past Medical History:  Diagnosis Date   Carpal tunnel syndrome, bilateral    Hx of varicella    Postpartum care following vaginal delivery (10/28) 12/30/2014   Thyroid disease    Past Surgical History:  Procedure Laterality Date   IUD REMOVAL     implanted in uterus   Family History  Problem Relation Age of Onset   Kidney Stones Maternal Aunt    Kidney Stones Maternal Grandfather    Cancer Maternal Grandfather        basal cell carcinoma   Heart Problems Maternal Grandfather    COPD Paternal Grandfather    Social History   Socioeconomic History   Marital status: Married    Spouse name: Not on file   Number of children: Not on file   Years of education: Not on file   Highest education level: Bachelor's degree (e.g., BA, AB, BS)  Occupational History   Not on file  Tobacco Use   Smoking status: Never   Smokeless tobacco: Never  Substance and Sexual Activity   Alcohol use: No   Drug use: No   Sexual activity: Yes  Other Topics Concern   Not on file  Social History Narrative   Not on file   Social Drivers of Health   Financial Resource Strain: Low Risk  (05/11/2023)   Overall Financial Resource Strain (CARDIA)    Difficulty of Paying Living Expenses: Not very hard  Food  Insecurity: No Food Insecurity (05/11/2023)   Hunger Vital Sign    Worried About Running Out of Food in the Last Year: Never true    Ran Out of Food in the Last Year: Never true  Transportation Needs: No Transportation Needs (05/11/2023)   PRAPARE - Administrator, Civil Service (Medical): No    Lack of Transportation (Non-Medical): No  Physical Activity: Insufficiently Active (05/11/2023)   Exercise Vital Sign    Days of Exercise per Week: 2 days    Minutes of Exercise per Session: 20 min  Stress: No Stress Concern Present (05/11/2023)   [employer] of Occupational Health - Occupational Stress Questionnaire    Feeling of Stress : Not at all  Social Connections: Socially Integrated (05/11/2023)   Social Connection and Isolation Panel [NHANES]    Frequency of Communication with Friends and Family: More than three times a week    Frequency of Social Gatherings with Friends and Family: More than three times a week    Attends Religious Services: More than 4 times per year    Active Member of [employer] or Organizations: Yes    Attends Engineer, structural: More than 4 times per year    Marital Status: Married     Review of Systems  Constitutional:  Negative for appetite change and unexpected weight  change.  HENT:  Negative for congestion and sinus pressure.        Ear fullness.   Respiratory:  Negative for cough, chest tightness and shortness of breath.   Cardiovascular:  Negative for chest pain, palpitations and leg swelling.  Gastrointestinal:  Negative for abdominal pain, diarrhea, nausea and vomiting.  Genitourinary:  Negative for difficulty urinating and dysuria.  Musculoskeletal:  Negative for joint swelling and myalgias.  Skin:  Negative for color change and rash.  Neurological:  Negative for dizziness and headaches.  Psychiatric/Behavioral:  Negative for agitation and dysphoric mood.        Objective:     BP 118/78   Pulse 78   Temp 97.7 F (36.5 C)  (Oral)   Ht 5\' 8"  (1.727 m)   Wt 148 lb 12.8 oz (67.5 kg)   LMP  (LMP Unknown)   SpO2 99%   BMI 22.62 kg/m  Wt Readings from Last 3 Encounters:  05/15/23 148 lb 12.8 oz (67.5 kg)  03/27/23 149 lb (67.6 kg)  12/19/22 146 lb (66.2 kg)    Physical Exam Vitals reviewed.  Constitutional:      General: She is not in acute distress.    Appearance: Normal appearance.  HENT:     Head: Normocephalic and atraumatic.     Right Ear: External ear normal.     Left Ear: External ear normal.     Ears:     Comments: Cerumen present - left ear.     Mouth/Throat:     Pharynx: No oropharyngeal exudate or posterior oropharyngeal erythema.  Eyes:     General: No scleral icterus.       Right eye: No discharge.        Left eye: No discharge.     Conjunctiva/sclera: Conjunctivae normal.  Neck:     Thyroid: No thyromegaly.  Cardiovascular:     Rate and Rhythm: Normal rate and regular rhythm.  Pulmonary:     Effort: No respiratory distress.     Breath sounds: Normal breath sounds. No wheezing.  Abdominal:     General: Bowel sounds are normal.     Palpations: Abdomen is soft.     Tenderness: There is no abdominal tenderness.  Musculoskeletal:        General: No swelling or tenderness.     Cervical back: Neck supple. No tenderness.  Lymphadenopathy:     Cervical: No cervical adenopathy.  Skin:    Findings: No erythema or rash.  Neurological:     Mental Status: She is alert.  Psychiatric:        Mood and Affect: Mood normal.        Behavior: Behavior normal.         Outpatient Encounter Medications as of 05/15/2023  Medication Sig   Cetirizine HCl 10 MG CAPS    cholecalciferol (VITAMIN D3) 25 MCG (1000 UNIT) tablet    etonogestrel (NEXPLANON) 68 MG IMPL implant Inject into the skin.   gabapentin (NEURONTIN) 300 MG capsule TAKE 1 CAPSULE BY MOUTH EVERYDAY AT BEDTIME   L-Theanine 200 MG CAPS    levothyroxine (SYNTHROID, LEVOTHROID) 100 MCG tablet Take 100 mcg by mouth daily before  breakfast.   Lysine 500 MG CAPS Take 1 capsule by mouth daily.   Multiple Vitamins-Minerals (WOMENS MULTI GUMMIES PO)    propranolol (INDERAL) 20 MG tablet TAKE 1 TABLET BY MOUTH 3 TIMES DAILY AS NEEDED (AS NEEDED FOR FAST HEART RATES).   ranitidine (ZANTAC) 75 MG tablet Take  75 mg by mouth as needed.    sertraline (ZOLOFT) 50 MG tablet Take 1.5 tablets (75 mg total) by mouth daily.   [DISCONTINUED] pimecrolimus (ELIDEL) 1 % cream Apply topically 2 (two) times daily.   [DISCONTINUED] sertraline (ZOLOFT) 50 MG tablet Take 1.5 tablets (75 mg total) by mouth daily.   No facility-administered encounter medications on file as of 05/15/2023.     Lab Results  Component Value Date   WBC 10.8 (H) 12/26/2020   HGB 13.2 12/26/2020   HCT 39.0 12/26/2020   PLT 271.0 12/26/2020   GLUCOSE 85 12/26/2020   ALT 13 12/26/2020   AST 12 12/26/2020   NA 137 12/26/2020   K 3.7 12/26/2020   CL 104 12/26/2020   CREATININE 0.82 12/26/2020   BUN 16 12/26/2020   CO2 26 12/26/2020   TSH 17.40 (H) 04/07/2017    MR Brain W Wo Contrast Result Date: 01/18/2021 CLINICAL DATA:  Left-sided face, arm, and leg numbness and tingling. Dizziness. EXAM: MRI HEAD WITHOUT AND WITH CONTRAST TECHNIQUE: Multiplanar, multiecho pulse sequences of the brain and surrounding structures were obtained without and with intravenous contrast. CONTRAST:  6mL GADAVIST GADOBUTROL 1 MMOL/ML IV SOLN COMPARISON:  None. FINDINGS: Brain: There is no evidence of an acute infarct, intracranial hemorrhage, mass, midline shift, or extra-axial fluid collection. The ventricles and sulci are normal. The brain is normal in signal. No abnormal brain parenchymal or meningeal enhancement is identified. A tiny developmental venous anomaly is incidentally noted in the right corona radiata. Vascular: Major intracranial vascular flow voids are preserved. Skull and upper cervical spine: Unremarkable bone marrow signal. Sinuses/Orbits: Unremarkable orbits. Paranasal  sinuses and mastoid air cells are clear. Other: None. IMPRESSION: Negative brain MRI. Electronically Signed   By: Sebastian Ache M.D.   On: 01/18/2021 16:51       Assessment & Plan:  Anxiety Assessment & Plan: Appears to be doing well on zoloft. Continue current dose.  Follow.  No changes today.    Thyroid nodule Assessment & Plan: Saw Dr Gershon Crane. Stable.  Recommended f/u ultrasound and labs - one year. On synthroid.  Follow. Will check tsh today since drawing labs and forward to Dr Val EagleElveria Rising.   Orders: -     TSH -     CBC with Differential/Platelet  Stress Assessment & Plan: Doing well on zoloft. Continue current medication. Follow. No changes today.   Orders: -     CBC with Differential/Platelet -     Comprehensive metabolic panel  Cervicogenic headache  Screening cholesterol level -     Lipid panel  Encounter for gynecological examination without abnormal finding -     Ambulatory referral to Gynecology  Paroxysmal tachycardia [facility]) Assessment & Plan: Previously saw Dr Lenard Galloway. Zio monitor - sinus tachycardia with no significant arrhythmia. On zoloft. Doing well. Follow.    Impacted cerumen of left ear Assessment & Plan: Debrox as directed. Follow.  Flonase as directed.    Other orders -     Sertraline HCl; Take 1.5 tablets (75 mg total) by mouth daily.  Dispense: 135 tablet; Refill: 1     Dale [place], MD

## 2023-05-15 NOTE — Assessment & Plan Note (Signed)
 Doing well on zoloft. Continue current medication. Follow. No changes today.

## 2023-05-15 NOTE — Assessment & Plan Note (Signed)
 Appears to be doing well on zoloft. Continue current dose.  Follow.  No changes today.

## 2023-05-15 NOTE — Assessment & Plan Note (Signed)
 Previously saw Dr Lenard Galloway. Zio monitor - sinus tachycardia with no significant arrhythmia. On zoloft. Doing well. Follow.

## 2023-05-16 ENCOUNTER — Encounter: Payer: Self-pay | Admitting: Internal Medicine

## 2023-05-21 NOTE — Progress Notes (Unsigned)
  [name] [address] [place] [address] Phone: [phone] Subjective:   Shelby Turner, am serving as a scribe for Dr. Antoine Primas.  I'm seeing this patient by the request  of:  Shelby Tunica, MD  CC: Neck pain and headache follow-up  [id number]  [name] is a 38 y.o. female coming in with complaint of back and neck pain. OMT 03/27/2023. Patient states that she is doing well.  Overall doing relatively well.  Some tenderness to palpation but nothing that stopping her from activity.  Continues to be more on a regular routine which she does think is helping as well.  Medications patient has been prescribed: Gabapentin  Taking:         Reviewed prior external information including notes and imaging from previsou exam, outside providers and external EMR if available.   As well as notes that were available from care everywhere and other healthcare systems.  Past medical history, social, surgical and family history all reviewed in electronic medical record.  No pertanent information unless stated regarding to the chief complaint.   Past Medical History:  Diagnosis Date   Carpal tunnel syndrome, bilateral    Hx of varicella    Postpartum care following vaginal delivery (10/28) 12/30/2014   Thyroid disease     No Known Allergies   Review of Systems:  No  visual changes, nausea, vomiting, diarrhea, constipation, dizziness, abdominal pain, skin rash, fevers, chills, night sweats, weight loss, swollen lymph nodes, body aches, joint swelling, chest pain, shortness of breath, mood changes. POSITIVE muscle aches, headache  Objective  Blood pressure 118/70, pulse 70, height 5\' 8"  (1.727 m), SpO2 97%, unknown if currently breastfeeding.   General: No apparent distress alert and oriented x3 mood and affect normal, dressed appropriately.  HEENT: Pupils equal, extraocular movements intact  Respiratory: Patient's speak in full  sentences and does not appear short of breath  Cardiovascular: No lower extremity edema, non tender, no erythema  MSK:  Back does have some loss lordosis noted.  Some tenderness with sidebending bilaterally.  Lacks last 5 degrees of extension.  Osteopathic findings  C2 flexed rotated and side bent right C6 flexed rotated and side bent left T3 extended rotated and side bent right inhaled rib T9 extended rotated and side bent left L2 flexed rotated and side bent right Sacrum right on right    Assessment and Plan:  Cervicogenic headache Continues to work well with conservative therapy including osteopathic manipulation at the moment.  After further evaluation manipulation done again today.  Discussed icing regimen at home exercises, discussed which activities to do and which ones to avoid.  Increase activity slowly.  Follow-up again in 6 to 8 weeks.    Nonallopathic problems  Decision today to treat with OMT was based on Physical Exam  After verbal consent patient was treated with HVLA, ME, FPR techniques in cervical, rib, thoracic, lumbar, and sacral  areas  Patient tolerated the procedure well with improvement in symptoms  Patient given exercises, stretches and lifestyle modifications  See medications in patient instructions if given  Patient will follow up in 4-8 weeks    The above documentation has been reviewed and is accurate and complete Judi Saa, DO          Note: This dictation was prepared with Dragon dictation along with smaller phrase technology. Any transcriptional errors that result from this process are unintentional.

## 2023-05-22 ENCOUNTER — Encounter: Payer: Self-pay | Admitting: Family Medicine

## 2023-05-22 ENCOUNTER — Ambulatory Visit: Payer: 59 | Admitting: Family Medicine

## 2023-05-22 VITALS — BP 118/70 | HR 70 | Ht 68.0 in

## 2023-05-22 DIAGNOSIS — M9908 Segmental and somatic dysfunction of rib cage: Secondary | ICD-10-CM

## 2023-05-22 DIAGNOSIS — G4486 Cervicogenic headache: Secondary | ICD-10-CM | POA: Diagnosis not present

## 2023-05-22 DIAGNOSIS — M9901 Segmental and somatic dysfunction of cervical region: Secondary | ICD-10-CM

## 2023-05-22 DIAGNOSIS — M9904 Segmental and somatic dysfunction of sacral region: Secondary | ICD-10-CM

## 2023-05-22 DIAGNOSIS — M9903 Segmental and somatic dysfunction of lumbar region: Secondary | ICD-10-CM | POA: Diagnosis not present

## 2023-05-22 DIAGNOSIS — M9902 Segmental and somatic dysfunction of thoracic region: Secondary | ICD-10-CM

## 2023-05-22 NOTE — Patient Instructions (Signed)
 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

## 2023-05-22 NOTE — Assessment & Plan Note (Signed)
 Continues to work well with conservative therapy including osteopathic manipulation at the moment.  After further evaluation manipulation done again today.  Discussed icing regimen at home exercises, discussed which activities to do and which ones to avoid.  Increase activity slowly.  Follow-up again in 6 to 8 weeks.

## 2023-07-11 ENCOUNTER — Other Ambulatory Visit: Payer: Self-pay | Admitting: Family Medicine

## 2023-07-16 NOTE — Progress Notes (Unsigned)
 [facility] [address] [place] [address] Phone: [phone] Subjective:   Shelby Turner, am serving as a scribe for Dr. Ronnell Coins.  I'm seeing this patient by the request  of:  Dellar Fenton, MD  CC: Back and neck pain follow-up  [id number]  Shelby Turner is a 38 y.o. female coming in with complaint of back and neck pain. OMT 05/22/2023. Patient states that she is doing well.  Has been having some tightness but nothing severe at this time.  Medications patient has been prescribed: Gabapentin   Taking:         Reviewed prior external information including notes and imaging from previsou exam, outside providers and external EMR if available.   As well as notes that were available from care everywhere and other healthcare systems.  Past medical history, social, surgical and family history all reviewed in electronic medical record.  No pertanent information unless stated regarding to the chief complaint.   Past Medical History:  Diagnosis Date   Carpal tunnel syndrome, bilateral    Hx of varicella    Postpartum care following vaginal delivery (10/28) 12/30/2014   Thyroid  disease     No Known Allergies   Review of Systems:  No headache, visual changes, nausea, vomiting, diarrhea, constipation, dizziness, abdominal pain, skin rash, fevers, chills, night sweats, weight loss, swollen lymph nodes, body aches, joint swelling, chest pain, shortness of breath, mood changes. POSITIVE muscle aches  Objective  Blood pressure 108/62, pulse 97, height 5\' 8"  (1.727 m), weight 150 lb (68 kg), SpO2 96%, unknown if currently breastfeeding.   General: No apparent distress alert and oriented x3 mood and affect normal, dressed appropriately.  HEENT: Pupils equal, extraocular movements intact  Respiratory: Patient's speak in full sentences and does not appear short of breath  Cardiovascular: No lower extremity edema, non tender, no erythema   Gait relatively normal MSK:  Back does have some loss lordosis of the neck.  Some stiffness noted with sidebending bilaterally.  Patient does not have any significant crepitus but does have tightness noted into the parascapular area left greater than right.  Osteopathic findings  C3 flexed rotated and side bent right C7 flexed rotated and side bent left T3 extended rotated and side bent right inhaled rib T8 extended rotated and side bent left L2 flexed rotated and side bent right Sacrum right on right    Assessment and Plan:  Cervicogenic headache Continue to work hard, has been on gabapentin , responding extremely well to osteopathic manipulation.  After further evaluation today attempted osteopathic manipulation again.  Discussed icing regimen and home exercises, discussed which activities to do and which ones to avoid.  Increase activity slowly.  Follow-up again in 6 to 8 weeks otherwise.    Nonallopathic problems  Decision today to treat with OMT was based on Physical Exam  After verbal consent patient was treated with HVLA, ME, FPR techniques in cervical, rib, thoracic, lumbar, and sacral  areas  Patient tolerated the procedure well with improvement in symptoms  Patient given exercises, stretches and lifestyle modifications  See medications in patient instructions if given  Patient will follow up in 4-8 weeks     The above documentation has been reviewed and is accurate and complete Shelby Taddei M Cecillia Menees, DO         Note: This dictation was prepared with Dragon dictation along with smaller phrase technology. Any transcriptional errors that result from this process are unintentional.

## 2023-07-17 ENCOUNTER — Ambulatory Visit: Admitting: Family Medicine

## 2023-07-17 ENCOUNTER — Encounter: Payer: Self-pay | Admitting: Family Medicine

## 2023-07-17 VITALS — BP 108/62 | HR 97 | Ht 68.0 in | Wt 150.0 lb

## 2023-07-17 DIAGNOSIS — G4486 Cervicogenic headache: Secondary | ICD-10-CM | POA: Diagnosis not present

## 2023-07-17 DIAGNOSIS — M9903 Segmental and somatic dysfunction of lumbar region: Secondary | ICD-10-CM

## 2023-07-17 DIAGNOSIS — M9904 Segmental and somatic dysfunction of sacral region: Secondary | ICD-10-CM

## 2023-07-17 DIAGNOSIS — M9902 Segmental and somatic dysfunction of thoracic region: Secondary | ICD-10-CM

## 2023-07-17 DIAGNOSIS — M9901 Segmental and somatic dysfunction of cervical region: Secondary | ICD-10-CM

## 2023-07-17 DIAGNOSIS — M9908 Segmental and somatic dysfunction of rib cage: Secondary | ICD-10-CM

## 2023-07-17 NOTE — Assessment & Plan Note (Signed)
 Continue to work hard, has been on gabapentin , responding extremely well to osteopathic manipulation.  After further evaluation today attempted osteopathic manipulation again.  Discussed icing regimen and home exercises, discussed which activities to do and which ones to avoid.  Increase activity slowly.  Follow-up again in 6 to 8 weeks otherwise.

## 2023-07-17 NOTE — Patient Instructions (Signed)
 Can't wait to see pictures See me again in 8 weeks

## 2023-09-24 NOTE — Progress Notes (Unsigned)
  [facility] [address] [place] [address] Phone: [phone] Subjective:   Shelby Turner, am serving as a scribe for Dr. Arthea Turner.  I'Shelby seeing this patient by the request  of:  Shelby Shad, MD  CC: neck and back pain   YEP:[id number]  Shelby Turner is a 38 y.o. female coming in with complaint of back and neck pain. OMT 07/17/2023. Patient states that she has been doing well.    Medications patient has been prescribed: Gabapentin   Taking:         Reviewed prior external information including notes and imaging from previsou exam, outside providers and external EMR if available.   As well as notes that were available from care everywhere and other healthcare systems.  Past medical history, social, surgical and family history all reviewed in electronic medical record.  No pertanent information unless stated regarding to the chief complaint.   Past Medical History:  Diagnosis Date   Carpal tunnel syndrome, bilateral    Hx of varicella    Postpartum care following vaginal delivery (10/28) 12/30/2014   Thyroid  disease     No Known Allergies   Review of Systems:  No headache, visual changes, nausea, vomiting, diarrhea, constipation, dizziness, abdominal pain, skin rash, fevers, chills, night sweats, weight loss, swollen lymph nodes, body aches, joint swelling, chest pain, shortness of breath, mood changes. POSITIVE muscle aches  Objective  Blood pressure 110/76, height 5' 8 (1.727 Shelby), weight 148 lb (67.1 kg), unknown if currently breastfeeding.   General: No apparent distress alert and oriented x3 mood and affect normal, dressed appropriately.  HEENT: Pupils equal, extraocular movements intact  Respiratory: Patient's speak in full sentences and does not appear short of breath  Cardiovascular: No lower extremity edema, non tender, no erythema  Gait MSK:  Back significant loss of lordosis noted.  Some tenderness to  palpation in the paraspinal musculature.  Patient's neck does have some limited sidebending bilaterally.  Osteopathic findings  C2 flexed rotated and side bent right C7 flexed rotated and side bent left T5 extended rotated and side bent right inhaled rib T9 extended rotated and side bent left L2 flexed rotated and side bent right Sacrum right on right     Assessment and Plan:  Cervicogenic headache Cervicogenic headaches.  Discussed icing regimen of home exercises, we discussed which activities to do and which ones to avoid.  Increase activity slowly.  Still seems to be more ergonomics at work that is contributing.  No other large.  Has the gabapentin  that she is taking regularly.  Follow-up with me again in 2 months    Nonallopathic problems  Decision today to treat with OMT was based on Physical Exam  After verbal consent patient was treated with HVLA, ME, FPR techniques in cervical, rib, thoracic, lumbar, and sacral  areas  Patient tolerated the procedure well with improvement in symptoms  Patient given exercises, stretches and lifestyle modifications  See medications in patient instructions if given  Patient will follow up in 4-8 weeks    The above documentation has been reviewed and is accurate and complete Shelby Turner Shelby Kenzel Ruesch, DO          Note: This dictation was prepared with Dragon dictation along with smaller phrase technology. Any transcriptional errors that result from this process are unintentional.

## 2023-09-25 ENCOUNTER — Ambulatory Visit: Admitting: Family Medicine

## 2023-09-25 ENCOUNTER — Encounter: Payer: Self-pay | Admitting: Family Medicine

## 2023-09-25 VITALS — BP 110/76 | Ht 68.0 in | Wt 148.0 lb

## 2023-09-25 DIAGNOSIS — M9902 Segmental and somatic dysfunction of thoracic region: Secondary | ICD-10-CM

## 2023-09-25 DIAGNOSIS — M9903 Segmental and somatic dysfunction of lumbar region: Secondary | ICD-10-CM | POA: Diagnosis not present

## 2023-09-25 DIAGNOSIS — G4486 Cervicogenic headache: Secondary | ICD-10-CM | POA: Diagnosis not present

## 2023-09-25 DIAGNOSIS — M9908 Segmental and somatic dysfunction of rib cage: Secondary | ICD-10-CM

## 2023-09-25 DIAGNOSIS — M9901 Segmental and somatic dysfunction of cervical region: Secondary | ICD-10-CM | POA: Diagnosis not present

## 2023-09-25 DIAGNOSIS — M9904 Segmental and somatic dysfunction of sacral region: Secondary | ICD-10-CM

## 2023-09-25 NOTE — Patient Instructions (Signed)
 Good to see you. Take a floaty to the party. See me again in 2 to 3 months.

## 2023-09-25 NOTE — Assessment & Plan Note (Signed)
 Cervicogenic headaches.  Discussed icing regimen of home exercises, we discussed which activities to do and which ones to avoid.  Increase activity slowly.  Still seems to be more ergonomics at work that is contributing.  No other large.  Has the gabapentin  that she is taking regularly.  Follow-up with me again in 2 months

## 2023-10-22 ENCOUNTER — Other Ambulatory Visit: Payer: Self-pay | Admitting: Family Medicine

## 2023-12-04 ENCOUNTER — Ambulatory Visit: Admitting: Family Medicine

## 2024-01-13 NOTE — Progress Notes (Unsigned)
  Shel[facility] Turner [facility] [address] [place] [address] Phone: [phone] Subjective:   Shelby Turner am a scribe for Dr. Claudene.   I'm seeing this patient by the request  of:  Shelby Shad, MD  CC: Back and neck pain follow-up  YEP:Dlagzrupcz  Shelby Turner is a 38 y.o. female coming in with complaint of back and neck pain. OMT 09/25/2023. Patient states that the back and neck is not bad today. The wrist was giving her an issues for the past two weeks but it has finally chilled out.   Medications patient has been prescribed: Gabapentin   Taking: yes         Reviewed prior external information including notes and imaging from previsou exam, outside providers and external EMR if available.   As well as notes that were available from care everywhere and other healthcare systems.  Past medical history, social, surgical and family history all reviewed in electronic medical record.  No pertanent information unless stated regarding to the chief complaint.   Past Medical History:  Diagnosis Date   Carpal tunnel syndrome, bilateral    Hx of varicella    Postpartum care following vaginal delivery (10/28) 12/30/2014   Thyroid  disease     No Known Allergies   Review of Systems:  No headache, visual changes, nausea, vomiting, diarrhea, constipation, dizziness, abdominal pain, skin rash, fevers, chills, night sweats, weight loss, swollen lymph nodes, body aches, joint swelling, chest pain, shortness of breath, mood changes. POSITIVE muscle aches, headache  Objective  Blood pressure 112/60, pulse 75, height 5' 8 (1.727 m), weight 149 lb (67.6 kg), SpO2 97%, unknown if currently breastfeeding.   General: No apparent distress alert and oriented x3 mood and affect normal, dressed appropriately.  HEENT: Pupils equal, extraocular movements intact  Respiratory: Patient's speak in full sentences and does not appear short of breath  Cardiovascular: No  lower extremity edema, non tender, no erythema  Tightness of the neck noted.  Some tightness with sidebending bilaterally right greater than left.  Osteopathic findings  C2 flexed rotated and side bent right C6 flexed rotated and side bent right T3 extended rotated and side bent left inhaled rib T6 extended rotated and side bent left T11 extended rotated and side bent right L2 flexed rotated and side bent right Sacrum right on right     Assessment and Plan:  Cervicogenic headache Patient has responded fairly well to the gabapentin  as well as the home exercises.  Responded extremely well to osteopathic manipulation today.  Discussed which activities to do and which ones to avoid.  Increase activity slowly.  Discussed icing regimen.  Follow-up again in 6 to 12 weeks    Nonallopathic problems  Decision today to treat with OMT was based on Physical Exam  After verbal consent patient was treated with HVLA, ME, FPR techniques in cervical, rib, thoracic, lumbar, and sacral  areas  Patient tolerated the procedure well with improvement in symptoms  Patient given exercises, stretches and lifestyle modifications  See medications in patient instructions if given  Patient will follow up in 4-8 weeks     The above documentation has been reviewed and is accurate and complete Shelby Turner M Shelby Conerly, DO         Note: This dictation was prepared with Dragon dictation along with smaller phrase technology. Any transcriptional errors that result from this process are unintentional.

## 2024-01-15 ENCOUNTER — Encounter: Payer: Self-pay | Admitting: Family Medicine

## 2024-01-15 ENCOUNTER — Ambulatory Visit: Admitting: Family Medicine

## 2024-01-15 VITALS — BP 112/60 | HR 75 | Ht 68.0 in | Wt 149.0 lb

## 2024-01-15 DIAGNOSIS — M9903 Segmental and somatic dysfunction of lumbar region: Secondary | ICD-10-CM | POA: Diagnosis not present

## 2024-01-15 DIAGNOSIS — M9904 Segmental and somatic dysfunction of sacral region: Secondary | ICD-10-CM | POA: Diagnosis not present

## 2024-01-15 DIAGNOSIS — M9902 Segmental and somatic dysfunction of thoracic region: Secondary | ICD-10-CM

## 2024-01-15 DIAGNOSIS — M9908 Segmental and somatic dysfunction of rib cage: Secondary | ICD-10-CM | POA: Diagnosis not present

## 2024-01-15 DIAGNOSIS — G4486 Cervicogenic headache: Secondary | ICD-10-CM | POA: Diagnosis not present

## 2024-01-15 DIAGNOSIS — M9901 Segmental and somatic dysfunction of cervical region: Secondary | ICD-10-CM

## 2024-01-15 NOTE — Patient Instructions (Addendum)
 Good to see you. Keep the other half in check.  See me again in 2 to 3 months.

## 2024-01-15 NOTE — Assessment & Plan Note (Signed)
 Patient has responded fairly well to the gabapentin  as well as the home exercises.  Responded extremely well to osteopathic manipulation today.  Discussed which activities to do and which ones to avoid.  Increase activity slowly.  Discussed icing regimen.  Follow-up again in 6 to 12 weeks

## 2024-01-22 ENCOUNTER — Ambulatory Visit: Admitting: Internal Medicine

## 2024-01-22 VITALS — BP 100/60 | HR 91 | Temp 98.6°F | Ht 68.0 in | Wt 147.2 lb

## 2024-01-22 DIAGNOSIS — I479 Paroxysmal tachycardia, unspecified: Secondary | ICD-10-CM | POA: Diagnosis not present

## 2024-01-22 DIAGNOSIS — Z13220 Encounter for screening for lipoid disorders: Secondary | ICD-10-CM

## 2024-01-22 DIAGNOSIS — F419 Anxiety disorder, unspecified: Secondary | ICD-10-CM

## 2024-01-22 DIAGNOSIS — E041 Nontoxic single thyroid nodule: Secondary | ICD-10-CM

## 2024-01-22 DIAGNOSIS — F439 Reaction to severe stress, unspecified: Secondary | ICD-10-CM | POA: Diagnosis not present

## 2024-01-22 DIAGNOSIS — G4486 Cervicogenic headache: Secondary | ICD-10-CM

## 2024-01-22 MED ORDER — SERTRALINE HCL 50 MG PO TABS
75.0000 mg | ORAL_TABLET | Freq: Every day | ORAL | 1 refills | Status: AC
Start: 2024-01-22 — End: ?

## 2024-01-22 NOTE — Progress Notes (Unsigned)
 Subjective:    Patient ID: Shelby Turner, female    DOB: September 29, 1985, 38 y.o.   MRN: [id number]  Patient here for  Chief Complaint  Patient presents with   Medical Management of Chronic Issues    6 mth f/u    HPI Here for a scheduled follow up - follow up regarding increased stress and headaches. Continues on zoloft .  Seeing Dr Claudene - cervicogenic headache. Last evaluated 01/15/24 - Stable. Continue gabapentin . Mostly has issues with her headaches around her menstrual period.  Saw Dr Cherilyn 07/31/23- thyroid  stable. Recommended f/u with labs and ultrasound (prior to next visit).    Past Medical History:  Diagnosis Date   Carpal tunnel syndrome, bilateral    Hx of varicella    Postpartum care following vaginal delivery (10/28) 12/30/2014   Thyroid  disease    Past Surgical History:  Procedure Laterality Date   IUD REMOVAL     implanted in uterus   Family History  Problem Relation Age of Onset   Kidney Stones Maternal Aunt    Kidney Stones Maternal Grandfather    Cancer Maternal Grandfather        basal cell carcinoma   Heart Problems Maternal Grandfather    COPD Paternal Grandfather    Social History   Socioeconomic History   Marital status: Married    Spouse name: Not on file   Number of children: Not on file   Years of education: Not on file   Highest education level: Bachelor's degree (e.g., BA, AB, BS)  Occupational History   Not on file  Tobacco Use   Smoking status: Never   Smokeless tobacco: Never  Substance and Sexual Activity   Alcohol use: No   Drug use: No   Sexual activity: Yes  Other Topics Concern   Not on file  Social History Narrative   Not on file   Social Drivers of Health   Financial Resource Strain: Low Risk  (01/18/2024)   Overall Financial Resource Strain (CARDIA)    Difficulty of Paying Living Expenses: Not hard at all  Food Insecurity: No Food Insecurity (01/18/2024)   Hunger Vital Sign    Worried About Running Out of Food in  the Last Year: Never true    Ran Out of Food in the Last Year: Never true  Transportation Needs: No Transportation Needs (01/18/2024)   PRAPARE - Administrator, Civil Service (Medical): No    Lack of Transportation (Non-Medical): No  Physical Activity: Inactive (01/18/2024)   Exercise Vital Sign    Days of Exercise per Week: 0 days    Minutes of Exercise per Session: Not on file  Stress: No Stress Concern Present (01/18/2024)   [employer] of Occupational Health - Occupational Stress Questionnaire    Feeling of Stress: Only a little  Social Connections: Socially Integrated (01/18/2024)   Social Connection and Isolation Panel    Frequency of Communication with Friends and Family: More than three times a week    Frequency of Social Gatherings with Friends and Family: More than three times a week    Attends Religious Services: More than 4 times per year    Active Member of Clubs or Organizations: Yes    Attends Banker Meetings: 1 to 4 times per year    Marital Status: Married     Review of Systems     Objective:     BP 100/60   Pulse 91   Temp 98.6 F (37  C) (Oral)   Ht 5' 8 (1.727 m)   Wt 147 lb 4 oz (66.8 kg)   SpO2 98%   BMI 22.39 kg/m  Wt Readings from Last 3 Encounters:  01/22/24 147 lb 4 oz (66.8 kg)  01/15/24 149 lb (67.6 kg)  09/25/23 148 lb (67.1 kg)    Physical Exam  {Perform Simple Foot Exam  Perform Detailed exam:1} {Insert foot Exam (Optional):30965}   Outpatient Encounter Medications as of 01/22/2024  Medication Sig   Cetirizine HCl 10 MG CAPS    cholecalciferol (VITAMIN D3) 25 MCG (1000 UNIT) tablet    etonogestrel (NEXPLANON) 68 MG IMPL implant Inject into the skin.   gabapentin  (NEURONTIN ) 300 MG capsule TAKE 1 CAPSULE BY MOUTH EVERYDAY AT BEDTIME   L-Theanine 200 MG CAPS    levothyroxine  (SYNTHROID , LEVOTHROID) 100 MCG tablet Take 100 mcg by mouth daily before breakfast.   Lysine 500 MG CAPS Take 1 capsule by  mouth daily.   Multiple Vitamins-Minerals (WOMENS MULTI GUMMIES PO)    propranolol  (INDERAL ) 20 MG tablet TAKE 1 TABLET BY MOUTH 3 TIMES DAILY AS NEEDED (AS NEEDED FOR FAST HEART RATES).   ranitidine (ZANTAC) 75 MG tablet Take 75 mg by mouth as needed.    sertraline  (ZOLOFT ) 50 MG tablet Take 1.5 tablets (75 mg total) by mouth daily.   [DISCONTINUED] sertraline  (ZOLOFT ) 50 MG tablet Take 1.5 tablets (75 mg total) by mouth daily.   No facility-administered encounter medications on file as of 01/22/2024.     Lab Results  Component Value Date   WBC 10.6 (H) 05/15/2023   HGB 13.1 05/15/2023   HCT 38.7 05/15/2023   PLT 238.0 05/15/2023   GLUCOSE 86 05/15/2023   CHOL 175 05/15/2023   TRIG 109.0 05/15/2023   HDL 60.50 05/15/2023   LDLCALC 93 05/15/2023   ALT 14 05/15/2023   AST 13 05/15/2023   NA 138 05/15/2023   K 3.6 05/15/2023   CL 103 05/15/2023   CREATININE 0.75 05/15/2023   BUN 16 05/15/2023   CO2 27 05/15/2023   TSH 1.40 05/15/2023    MR Brain W Wo Contrast Result Date: 01/18/2021 CLINICAL DATA:  Left-sided face, arm, and leg numbness and tingling. Dizziness. EXAM: MRI HEAD WITHOUT AND WITH CONTRAST TECHNIQUE: Multiplanar, multiecho pulse sequences of the brain and surrounding structures were obtained without and with intravenous contrast. CONTRAST:  6mL GADAVIST  GADOBUTROL  1 MMOL/ML IV SOLN COMPARISON:  None. FINDINGS: Brain: There is no evidence of an acute infarct, intracranial hemorrhage, mass, midline shift, or extra-axial fluid collection. The ventricles and sulci are normal. The brain is normal in signal. No abnormal brain parenchymal or meningeal enhancement is identified. A tiny developmental venous anomaly is incidentally noted in the right corona radiata. Vascular: Major intracranial vascular flow voids are preserved. Skull and upper cervical spine: Unremarkable bone marrow signal. Sinuses/Orbits: Unremarkable orbits. Paranasal sinuses and mastoid air cells are clear.  Other: None. IMPRESSION: Negative brain MRI. Electronically Signed   By: Dasie Hamburg M.D.   On: 01/18/2021 16:51       Assessment & Plan:  Screening cholesterol level  Stress  Thyroid  nodule  Other orders -     Sertraline  HCl; Take 1.5 tablets (75 mg total) by mouth daily.  Dispense: 135 tablet; Refill: 1     Allena Hamilton, MD

## 2024-01-24 ENCOUNTER — Encounter: Payer: Self-pay | Admitting: Internal Medicine

## 2024-01-24 NOTE — Assessment & Plan Note (Signed)
 Overall appears to be doing well. Continue zoloft  - current dose. Follow.

## 2024-01-24 NOTE — Assessment & Plan Note (Signed)
 Seeing Dr Claudene - cervicogenic headache. Last evaluated 01/15/24 - Stable. Continue gabapentin . Mostly has issues with her headaches around her menstrual period. overall reports she is doing better.

## 2024-01-24 NOTE — Assessment & Plan Note (Signed)
 Previously saw Dr Gollen. Zio monitor - sinus tachycardia with no significant arrhythmia. On zoloft . Follow. Appears to be doing well.

## 2024-01-24 NOTE — Assessment & Plan Note (Signed)
 Saw Dr Cherilyn 07/31/23- thyroid  stable. Recommended f/u with labs and ultrasound (prior to next visit).

## 2024-01-24 NOTE — Assessment & Plan Note (Signed)
 Continues on zoloft . Overall doing well on current dose. Follow.

## 2024-01-31 ENCOUNTER — Other Ambulatory Visit: Payer: Self-pay | Admitting: Family Medicine

## 2024-03-24 NOTE — Progress Notes (Unsigned)
" °  Darlyn [facility] [address] [place] [address] Phone: [phone] Subjective:   Shelby Turner, am serving as a scribe for Dr. Arthea Claudene.  I'm seeing this patient by the request  of:  [name], MD  CC: Back and neck pain follow-up  Shelby Turner  Shelby Turner is a 39 y.o. female coming in with complaint of back and neck pain. OMT 01/15/2024. Patient states that she has been doing well since last visit.  Medications patient has been prescribed: Gabapentin   Taking:         Reviewed prior external information including notes and imaging from previsou exam, outside providers and external EMR if available.   As well as notes that were available from care everywhere and other healthcare systems.  Past medical history, social, surgical and family history all reviewed in electronic medical record.  No pertanent information unless stated regarding to the chief complaint.   Past Medical History:  Diagnosis Date   Carpal tunnel syndrome, bilateral    Hx of varicella    Postpartum care following vaginal delivery (10/28) 12/30/2014   Thyroid  disease     Allergies[1]   Review of Systems:  No  visual changes, nausea, vomiting, diarrhea, constipation, dizziness, abdominal pain, skin rash, fevers, chills, night sweats, weight loss, swollen lymph nodes, body aches, joint swelling, chest pain, shortness of breath, mood changes. POSITIVE muscle aches, intermittent headaches  Objective  Blood pressure 114/72, pulse 79, height 5' 8 (1.727 m), weight 151 lb (68.5 kg), SpO2 98%, unknown if currently breastfeeding.   General: No apparent distress alert and oriented x3 mood and affect normal, dressed appropriately.  HEENT: Pupils equal, extraocular movements intact  Respiratory: Patient's speak in full sentences and does not appear short of breath  Cardiovascular: No lower extremity edema, non tender, no erythema  Neck exam does have some  loss of lordosis.  Some tightness noted.  Questionable shotty lymph nodes noted.  No increase in tenderness in the midline.  Patient does have tightness noted  Osteopathic findings  C2 flexed rotated and side bent right C3 flexed rotated left side bent left C7 flexed rotated and side bent left T3 extended rotated and side bent right inhaled rib T9 extended rotated and side bent left L2 flexed rotated and side bent right Sacrum right on right       Assessment and Plan:  Cervicogenic headache Discussed home exercises.  Less headaches overall.  Patient is going to work a little more on core strengthening.  Discussed which activities to do and which ones to avoid.  Increase activity slowly.  Discussed icing regimen.  Follow-up again in 6 to 12 weeks.    Nonallopathic problems  Decision today to treat with OMT was based on Physical Exam  After verbal consent patient was treated with HVLA, ME, FPR techniques in cervical, rib, thoracic, lumbar, and sacral  areas  Patient tolerated the procedure well with improvement in symptoms  Patient given exercises, stretches and lifestyle modifications  See medications in patient instructions if given  Patient will follow up in 4-8 weeks      The above documentation has been reviewed and is accurate and complete Shelby Mowbray M Nalee Lightle, DO        Note: This dictation was prepared with Dragon dictation along with smaller phrase technology. Any transcriptional errors that result from this process are unintentional.            [1] No Known Allergies  "

## 2024-03-25 ENCOUNTER — Encounter: Payer: Self-pay | Admitting: Family Medicine

## 2024-03-25 ENCOUNTER — Ambulatory Visit: Admitting: Family Medicine

## 2024-03-25 VITALS — BP 114/72 | HR 79 | Ht 68.0 in | Wt 151.0 lb

## 2024-03-25 DIAGNOSIS — M9902 Segmental and somatic dysfunction of thoracic region: Secondary | ICD-10-CM

## 2024-03-25 DIAGNOSIS — M9908 Segmental and somatic dysfunction of rib cage: Secondary | ICD-10-CM

## 2024-03-25 DIAGNOSIS — M9904 Segmental and somatic dysfunction of sacral region: Secondary | ICD-10-CM | POA: Diagnosis not present

## 2024-03-25 DIAGNOSIS — G4486 Cervicogenic headache: Secondary | ICD-10-CM

## 2024-03-25 DIAGNOSIS — M9901 Segmental and somatic dysfunction of cervical region: Secondary | ICD-10-CM | POA: Diagnosis not present

## 2024-03-25 DIAGNOSIS — M9903 Segmental and somatic dysfunction of lumbar region: Secondary | ICD-10-CM

## 2024-03-25 NOTE — Patient Instructions (Signed)
 Hip ex See me in 2 months

## 2024-03-25 NOTE — Assessment & Plan Note (Signed)
 Discussed home exercises.  Less headaches overall.  Patient is going to work a little more on core strengthening.  Discussed which activities to do and which ones to avoid.  Increase activity slowly.  Discussed icing regimen.  Follow-up again in 6 to 12 weeks.

## 2024-05-20 ENCOUNTER — Ambulatory Visit: Admitting: Family Medicine

## 2024-08-05 ENCOUNTER — Encounter: Admitting: Internal Medicine
